# Patient Record
Sex: Female | Born: 1990 | Race: White | Hispanic: No | Marital: Single | Smoking: Never smoker
Health system: Southern US, Community
[De-identification: ages and names within clinical notes are randomized; demographics above are authoritative.]

## PROBLEM LIST (undated history)

## (undated) DIAGNOSIS — J45909 Unspecified asthma, uncomplicated: Secondary | ICD-10-CM

## (undated) HISTORY — DX: Unspecified asthma, uncomplicated: J45.909

---
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
## 2014-12-14 NOTE — L&D Delivery Note (Cosign Needed)
Delivery Note  Patient presented evening of 12/2 for induction for  Oligohydramnios. Cytotec and foley utilized for ripening. Pitocin then given. No significant fetal heart rate abnormalities.   At  a viable female infant was delivered via  (Presentation: OA).  APGAR: 8/9 ; weight  3030 g.   Placenta status: normal Cord: 3 vessel.  with the following complications: none.  Cord pH: not obtained  Anesthesia: Epidural  Episiotomy:  none Lacerations:  Right periuretheral Suture Repair: 4-0 vicryl Est. Blood Loss (mL):  50 ml  Mom to postpartum.  Baby to Couplet care / Skin to Skin.  Rebecca Sullivan 11/17/2015, 3:20 AM

## 2015-05-16 ENCOUNTER — Other Ambulatory Visit (HOSPITAL_COMMUNITY): Payer: Self-pay | Admitting: Nurse Practitioner

## 2015-05-16 DIAGNOSIS — O3680X9 Pregnancy with inconclusive fetal viability, other fetus: Secondary | ICD-10-CM

## 2015-05-16 LAB — OB RESULTS CONSOLE VARICELLA ZOSTER ANTIBODY, IGG: Varicella: NON-IMMUNE/NOT IMMUNE

## 2015-05-16 LAB — OB RESULTS CONSOLE ABO/RH: RH TYPE: POSITIVE

## 2015-05-16 LAB — OB RESULTS CONSOLE GC/CHLAMYDIA
CHLAMYDIA, DNA PROBE: NEGATIVE
GC PROBE AMP, GENITAL: NEGATIVE

## 2015-05-16 LAB — CULTURE, OB URINE: Urine Culture, OB: NEGATIVE

## 2015-05-16 LAB — CYTOLOGY - PAP: CYTOLOGY - PAP: NEGATIVE

## 2015-05-16 LAB — OB RESULTS CONSOLE ANTIBODY SCREEN: ANTIBODY SCREEN: NEGATIVE

## 2015-05-16 LAB — OB RESULTS CONSOLE HEPATITIS B SURFACE ANTIGEN: Hepatitis B Surface Ag: NEGATIVE

## 2015-05-16 LAB — OB RESULTS CONSOLE HGB/HCT, BLOOD
HCT: 44 %
HEMOGLOBIN: 13.8 g/dL

## 2015-05-16 LAB — OB RESULTS CONSOLE PLATELET COUNT: Platelets: 346 10*3/uL

## 2015-05-16 LAB — CYSTIC FIBROSIS DIAGNOSTIC STUDY: Interpretation-CFDNA:: NEGATIVE

## 2015-05-16 LAB — OB RESULTS CONSOLE RPR: RPR: NONREACTIVE

## 2015-05-16 LAB — OB RESULTS CONSOLE RUBELLA ANTIBODY, IGM: Rubella: NON-IMMUNE/NOT IMMUNE

## 2015-05-16 LAB — DRUG SCREEN, URINE: DRUG SCREEN, URINE: NEGATIVE

## 2015-05-16 LAB — OB RESULTS CONSOLE HIV ANTIBODY (ROUTINE TESTING): HIV: NONREACTIVE

## 2015-05-21 ENCOUNTER — Ambulatory Visit (HOSPITAL_COMMUNITY): Payer: Self-pay

## 2015-05-23 ENCOUNTER — Encounter (HOSPITAL_COMMUNITY): Payer: Self-pay

## 2015-05-23 ENCOUNTER — Other Ambulatory Visit (HOSPITAL_COMMUNITY): Payer: Self-pay | Admitting: Nurse Practitioner

## 2015-05-23 ENCOUNTER — Ambulatory Visit (HOSPITAL_COMMUNITY): Admission: RE | Admit: 2015-05-23 | Payer: BLUE CROSS/BLUE SHIELD | Source: Ambulatory Visit

## 2015-05-23 ENCOUNTER — Ambulatory Visit (HOSPITAL_COMMUNITY)
Admission: RE | Admit: 2015-05-23 | Discharge: 2015-05-23 | Disposition: A | Discharge status: Home / Self Care | Payer: BLUE CROSS/BLUE SHIELD | Source: Ambulatory Visit | Attending: Nurse Practitioner | Admitting: Nurse Practitioner

## 2015-05-23 DIAGNOSIS — O3680X0 Pregnancy with inconclusive fetal viability, not applicable or unspecified: Secondary | ICD-10-CM | POA: Diagnosis present

## 2015-05-23 DIAGNOSIS — Z810 Family history of intellectual disabilities: Secondary | ICD-10-CM

## 2015-05-23 DIAGNOSIS — Z3A11 11 weeks gestation of pregnancy: Secondary | ICD-10-CM

## 2015-05-23 DIAGNOSIS — O3680X9 Pregnancy with inconclusive fetal viability, other fetus: Secondary | ICD-10-CM

## 2015-05-23 DIAGNOSIS — Z8279 Family history of other congenital malformations, deformations and chromosomal abnormalities: Secondary | ICD-10-CM

## 2015-05-28 NOTE — Progress Notes (Signed)
Genetic Counseling  High-Risk Gestation Note  Appointment Date:  05/23/2015 Referred By: Rebecca Sullivan Date of Birth:  10-17-1991 Partner:  Rebecca Sullivan   Pregnancy History: [id number] Estimated Date of Delivery: 12/06/15 Estimated Gestational Age: 73w6dAttending: BSeward Meth MD   I met with Rebecca Sullivan her partner, Rebecca Sullivan for genetic counseling because of a family history of congenital heart disease.  We began by reviewing the family history in detail. Mr. Rebecca Bushreported that his sister, Rebecca Sullivan was born with a hole in the heart that required surgical correction at birth. She has been treated and evaluated by cardi[place] in [place] will reportedly need another surgery for her heart. She is currently 24years old and otherwise healthy. No underlying cause was known for her congenital heart disease. No additional relatives were reported with congenital heart disease. Congenital heart defects occur in approximately 1% of pregnancies.  Congenital heart defects may occur due to multifactorial influences, chromosomal abnormalities, genetic syndromes or environmental exposures.  Isolated heart defects are generally multifactorial.  Given the reported family history and assuming multifactorial inheritance, the risk for a congenital heart defect in the current pregnancy would be approximately 1-2%. Additional information regarding the family history may alter recurrence risk assessment. We discussed the options of targeted ultrasound in the second trimester and fetal echocardiogram. They understand that prenatal ultrasound cannot diagnose or rule out all birth defects, including all congenital heart disease. Detailed ultrasound is scheduled in our office for 07/11/15, and fetal echocardiogram is scheduled for 07/12/15.   Additionally, Rebecca Sullivan reported that her sister had a son who was placed for adoption. He has mental delays and speech delay at age 1567years.  The mother reportedly had exposures to alcohol and drugs during that pregnancy. Rebecca Sullivan counseled that there are many different causes of intellectual disabilities including environmental, multifactorial, and genetic etiologies.  We discussed that a specific diagnosis for intellectual disability can be determined in approximately 50% of these individuals.  In the remaining 50% of individuals, a diagnosis may never be determined. Given the reported family history, environmental exposures in pregnancy may be the underlying cause for his features.  Regarding genetic causes, we discussed that chromosome aberrations (aneuploidy, deletions, duplications, insertions, and translocations) are responsible for a small percentage of individuals with intellectual disability.  Many individuals with chromosome aberrations have additional differences, including congenital anomalies or minor dysmorphisms.  Likewise, single gene conditions are the underlying cause of intellectual delay in some families.  We discussed that many gene conditions have intellectual disability as a feature, but also often include other physical or medical differences.  Specifically, we reviewed fragile X syndrome and the X-linked inheritance of this condition.  We discussed the option of FMR1 (the gene that when altered causes fragile X syndrome) analysis to determine whether Fragile X syndrome is the cause of intellectual disability in this family.  Sullivan. WKettlewelldeclined FMR1 analysis today.   Further genetic counseling is warranted if more information is obtained.  The family histories were otherwise found to be noncontributory for birth defects, mental retardation, and known genetic conditions. Without further information regarding the Sullivan family history, an accurate genetic risk cannot be calculated. Further genetic counseling is warranted if more information is obtained.  We reviewed available screening and diagnostic  options.  Regarding screening tests, we discussed the options of First screen, Quad screen and ultrasound. They understand that screening tests are used to modify a patient's a priori risk for  aneuploidy, typically based on age.  This estimate provides a pregnancy specific risk assessment. After reviewing these options, Rebecca Sullivan elected to have dating ultrasound and second trimester ultrasound, but declined additional screening for fetal aneuploidy including First trimester screen, NT ultrasound, and Quad screen.    She understands that ultrasound cannot rule out all birth defects or genetic syndromes. The patient was advised of this limitation and states she still does not want diagnostic testing at this time.   Rebecca Sullivan with written information regarding cystic fibrosis (CF) including the carrier frequency and incidence in the Caucasian population, the availability of carrier testing and prenatal diagnosis if indicated.  In addition, we discussed that CF is routinely screened for as part of the [place] newborn screening panel.  She declined CF testing today, given that it is possibly being performed through her OB office.   Rebecca Sullivan denied exposure to environmental toxins or chemical agents. She denied the use of alcohol, tobacco or street drugs. She denied significant viral illnesses during the course of her pregnancy. Her medical and surgical histories were noncontributory.   I counseled this couple regarding the above risks and available options.  The approximate face-to-face time with the genetic counselor was 45 minutes.  Rebecca Sullivan Certified Genetic Counselor 05/28/2015 4:57 PM            Rebecca Sullivan 05/28/2015

## 2015-07-10 ENCOUNTER — Other Ambulatory Visit (HOSPITAL_COMMUNITY): Payer: Self-pay | Admitting: Nurse Practitioner

## 2015-07-10 DIAGNOSIS — Z8279 Family history of other congenital malformations, deformations and chromosomal abnormalities: Secondary | ICD-10-CM

## 2015-07-10 DIAGNOSIS — Z3A18 18 weeks gestation of pregnancy: Secondary | ICD-10-CM

## 2015-07-10 DIAGNOSIS — Z8249 Family history of ischemic heart disease and other diseases of the circulatory system: Secondary | ICD-10-CM

## 2015-07-10 DIAGNOSIS — Z3689 Encounter for other specified antenatal screening: Secondary | ICD-10-CM

## 2015-07-11 ENCOUNTER — Encounter (HOSPITAL_COMMUNITY): Payer: Self-pay

## 2015-07-11 ENCOUNTER — Ambulatory Visit (HOSPITAL_COMMUNITY)
Admission: RE | Admit: 2015-07-11 | Discharge: 2015-07-11 | Disposition: A | Discharge status: Home / Self Care | Payer: BLUE CROSS/BLUE SHIELD | Source: Ambulatory Visit | Attending: Family | Admitting: Family

## 2015-07-11 VITALS — BP 139/81 | HR 92 | Wt 251.2 lb

## 2015-07-11 DIAGNOSIS — Z3A18 18 weeks gestation of pregnancy: Secondary | ICD-10-CM | POA: Diagnosis not present

## 2015-07-11 DIAGNOSIS — Z36 Encounter for antenatal screening of mother: Secondary | ICD-10-CM | POA: Diagnosis present

## 2015-07-11 DIAGNOSIS — Z8279 Family history of other congenital malformations, deformations and chromosomal abnormalities: Secondary | ICD-10-CM | POA: Diagnosis not present

## 2015-07-11 DIAGNOSIS — Z3689 Encounter for other specified antenatal screening: Secondary | ICD-10-CM | POA: Insufficient documentation

## 2015-07-11 DIAGNOSIS — Z0489 Encounter for examination and observation for other specified reasons: Secondary | ICD-10-CM

## 2015-07-11 DIAGNOSIS — IMO0002 Reserved for concepts with insufficient information to code with codable children: Secondary | ICD-10-CM

## 2015-07-11 LAB — AFP MATERNAL TRIPLE SCREEN: AFP, Serum: NEGATIVE

## 2015-07-12 ENCOUNTER — Other Ambulatory Visit (HOSPITAL_COMMUNITY): Payer: Self-pay | Admitting: Family

## 2015-07-12 ENCOUNTER — Other Ambulatory Visit (HOSPITAL_COMMUNITY): Payer: Self-pay | Admitting: Physician Assistant

## 2015-08-08 ENCOUNTER — Encounter (HOSPITAL_COMMUNITY): Payer: Self-pay

## 2015-08-08 ENCOUNTER — Ambulatory Visit (HOSPITAL_COMMUNITY)
Admission: RE | Admit: 2015-08-08 | Discharge: 2015-08-08 | Disposition: A | Discharge status: Home / Self Care | Payer: Medicaid Other | Source: Ambulatory Visit | Attending: Physician Assistant | Admitting: Physician Assistant

## 2015-08-08 DIAGNOSIS — Z8279 Family history of other congenital malformations, deformations and chromosomal abnormalities: Secondary | ICD-10-CM | POA: Insufficient documentation

## 2015-08-08 DIAGNOSIS — Z0489 Encounter for examination and observation for other specified reasons: Secondary | ICD-10-CM

## 2015-08-08 DIAGNOSIS — IMO0002 Reserved for concepts with insufficient information to code with codable children: Secondary | ICD-10-CM

## 2015-08-08 DIAGNOSIS — Z36 Encounter for antenatal screening of mother: Secondary | ICD-10-CM | POA: Diagnosis not present

## 2015-08-13 ENCOUNTER — Encounter: Payer: Self-pay | Admitting: Obstetrics and Gynecology

## 2015-08-13 ENCOUNTER — Ambulatory Visit (INDEPENDENT_AMBULATORY_CARE_PROVIDER_SITE_OTHER): Payer: Medicaid Other | Admitting: Obstetrics and Gynecology

## 2015-08-13 VITALS — BP 138/67 | HR 101 | Temp 98.2°F | Ht 72.0 in | Wt 255.3 lb

## 2015-08-13 DIAGNOSIS — O99210 Obesity complicating pregnancy, unspecified trimester: Secondary | ICD-10-CM | POA: Insufficient documentation

## 2015-08-13 DIAGNOSIS — Z23 Encounter for immunization: Secondary | ICD-10-CM | POA: Diagnosis not present

## 2015-08-13 DIAGNOSIS — O10919 Unspecified pre-existing hypertension complicating pregnancy, unspecified trimester: Secondary | ICD-10-CM

## 2015-08-13 DIAGNOSIS — Z8739 Personal history of other diseases of the musculoskeletal system and connective tissue: Secondary | ICD-10-CM | POA: Insufficient documentation

## 2015-08-13 DIAGNOSIS — O9989 Other specified diseases and conditions complicating pregnancy, childbirth and the puerperium: Secondary | ICD-10-CM

## 2015-08-13 DIAGNOSIS — Z789 Other specified health status: Secondary | ICD-10-CM | POA: Diagnosis not present

## 2015-08-13 DIAGNOSIS — O99212 Obesity complicating pregnancy, second trimester: Secondary | ICD-10-CM | POA: Diagnosis not present

## 2015-08-13 DIAGNOSIS — Z2839 Other underimmunization status: Secondary | ICD-10-CM

## 2015-08-13 DIAGNOSIS — O0992 Supervision of high risk pregnancy, unspecified, second trimester: Secondary | ICD-10-CM

## 2015-08-13 DIAGNOSIS — Z283 Underimmunization status: Secondary | ICD-10-CM | POA: Insufficient documentation

## 2015-08-13 DIAGNOSIS — O10912 Unspecified pre-existing hypertension complicating pregnancy, second trimester: Secondary | ICD-10-CM

## 2015-08-13 DIAGNOSIS — Z8279 Family history of other congenital malformations, deformations and chromosomal abnormalities: Secondary | ICD-10-CM

## 2015-08-13 DIAGNOSIS — O09899 Supervision of other high risk pregnancies, unspecified trimester: Secondary | ICD-10-CM

## 2015-08-13 DIAGNOSIS — O0990 Supervision of high risk pregnancy, unspecified, unspecified trimester: Secondary | ICD-10-CM | POA: Insufficient documentation

## 2015-08-13 DIAGNOSIS — E669 Obesity, unspecified: Secondary | ICD-10-CM | POA: Diagnosis not present

## 2015-08-13 LAB — POCT URINALYSIS DIP (DEVICE)
Bilirubin Urine: NEGATIVE
GLUCOSE, UA: NEGATIVE mg/dL
Nitrite: NEGATIVE
PH: 6 (ref 5.0–8.0)
Protein, ur: NEGATIVE mg/dL
Specific Gravity, Urine: >=1.03 (ref 1.005–1.030)
Urobilinogen, UA: 0.2 mg/dL (ref 0.0–1.0)

## 2015-08-13 NOTE — Progress Notes (Signed)
Breastfeeding tip of the week reviewed Initial prenatal visit at clinic, transfer from [facility] for HTN Flu vaccine Pt thinks she may have UTI due to urine frequency Ketones: trace, Hgb: trace, Leukocytes: trace

## 2015-08-13 NOTE — Patient Instructions (Signed)
Second Trimester of Pregnancy The second trimester is from week 13 through week 28, months 4 through 6. The second trimester is often a time when you feel your best. Your body has also adjusted to being pregnant, and you begin to feel better physically. Usually, morning sickness has lessened or quit completely, you may have more energy, and you may have an increase in appetite. The second trimester is also a time when the fetus is growing rapidly. At the end of the sixth month, the fetus is about 9 inches long and weighs about 1 pounds. You will likely begin to feel the baby move (quickening) between 18 and 20 weeks of the pregnancy. BODY CHANGES Your body goes through many changes during pregnancy. The changes vary from woman to woman.   Your weight will continue to increase. You will notice your lower abdomen bulging out.  You may begin to get stretch marks on your hips, abdomen, and breasts.  You may develop headaches that can be relieved by medicines approved by your health care provider.  You may urinate more often because the fetus is pressing on your bladder.  You may develop or continue to have heartburn as a result of your pregnancy.  You may develop constipation because certain hormones are causing the muscles that push waste through your intestines to slow down.  You may develop hemorrhoids or swollen, bulging veins (varicose veins).  You may have back pain because of the weight gain and pregnancy hormones relaxing your joints between the bones in your pelvis and as a result of a shift in weight and the muscles that support your balance.  Your breasts will continue to grow and be tender.  Your gums may bleed and may be sensitive to brushing and flossing.  Dark spots or blotches (chloasma, mask of pregnancy) may develop on your face. This will likely fade after the baby is born.  A dark line from your belly button to the pubic area (linea nigra) may appear. This will likely  fade after the baby is born.  You may have changes in your hair. These can include thickening of your hair, rapid growth, and changes in texture. Some women also have hair loss during or after pregnancy, or hair that feels dry or thin. Your hair will most likely return to normal after your baby is born. WHAT TO EXPECT AT YOUR PRENATAL VISITS During a routine prenatal visit:  You will be weighed to make sure you and the fetus are growing normally.  Your blood pressure will be taken.  Your abdomen will be measured to track your baby's growth.  The fetal heartbeat will be listened to.  Any test results from the previous visit will be discussed. Your health care provider may ask you:  How you are feeling.  If you are feeling the baby move.  If you have had any abnormal symptoms, such as leaking fluid, bleeding, severe headaches, or abdominal cramping.  If you have any questions. Other tests that may be performed during your second trimester include:  Blood tests that check for:  Low iron levels (anemia).  Gestational diabetes (between 24 and 28 weeks).  Rh antibodies.  Urine tests to check for infections, diabetes, or protein in the urine.  An ultrasound to confirm the proper growth and development of the baby.  An amniocentesis to check for possible genetic problems.  Fetal screens for spina bifida and Down syndrome. HOME CARE INSTRUCTIONS   Avoid all smoking, herbs, alcohol, and unprescribed   drugs. These chemicals affect the formation and growth of the baby.  Follow your health care provider's instructions regarding medicine use. There are medicines that are either safe or unsafe to take during pregnancy.  Exercise only as directed by your health care provider. Experiencing uterine cramps is a good sign to stop exercising.  Continue to eat regular, healthy meals.  Wear a good support bra for breast tenderness.  Do not use hot tubs, steam rooms, or saunas.  Wear  your seat belt at all times when driving.  Avoid raw meat, uncooked cheese, cat litter boxes, and soil used by cats. These carry germs that can cause birth defects in the baby.  Take your prenatal vitamins.  Try taking a stool softener (if your health care provider approves) if you develop constipation. Eat more high-fiber foods, such as fresh vegetables or fruit and whole grains. Drink plenty of fluids to keep your urine clear or pale yellow.  Take warm sitz baths to soothe any pain or discomfort caused by hemorrhoids. Use hemorrhoid cream if your health care provider approves.  If you develop varicose veins, wear support hose. Elevate your feet for 15 minutes, 3-4 times a day. Limit salt in your diet.  Avoid heavy lifting, wear low heel shoes, and practice good posture.  Rest with your legs elevated if you have leg cramps or low back pain.  Visit your dentist if you have not gone yet during your pregnancy. Use a soft toothbrush to brush your teeth and be gentle when you floss.  A sexual relationship may be continued unless your health care provider directs you otherwise.  Continue to go to all your prenatal visits as directed by your health care provider. SEEK MEDICAL CARE IF:   You have dizziness.  You have mild pelvic cramps, pelvic pressure, or nagging pain in the abdominal area.  You have persistent nausea, vomiting, or diarrhea.  You have a bad smelling vaginal discharge.  You have pain with urination. SEEK IMMEDIATE MEDICAL CARE IF:   You have a fever.  You are leaking fluid from your vagina.  You have spotting or bleeding from your vagina.  You have severe abdominal cramping or pain.  You have rapid weight gain or loss.  You have shortness of breath with chest pain.  You notice sudden or extreme swelling of your face, hands, ankles, feet, or legs.  You have not felt your baby move in over an hour.  You have severe headaches that do not go away with  medicine.  You have vision changes. Document Released: 11/24/2001 Document Revised: 12/05/2013 Document Reviewed: 01/31/2013 [employer] Patient Information 2015 [employer]. This information is not intended to replace advice given to you by your health care provider. Make sure you discuss any questions you have with your health care provider.  Contraception Choices Contraception (birth control) is the use of any methods or devices to prevent pregnancy. Below are some methods to help avoid pregnancy. HORMONAL METHODS   Contraceptive implant. This is a thin, plastic tube containing progesterone hormone. It does not contain estrogen hormone. Your health care provider inserts the tube in the inner part of the upper arm. The tube can remain in place for up to 3 years. After 3 years, the implant must be removed. The implant prevents the ovaries from releasing an egg (ovulation), thickens the cervical mucus to prevent sperm from entering the uterus, and thins the lining of the inside of the uterus.  Progesterone-only injections. These injections are given   every 3 months by your health care provider to prevent pregnancy. This synthetic progesterone hormone stops the ovaries from releasing eggs. It also thickens cervical mucus and changes the uterine lining. This makes it harder for sperm to survive in the uterus.  Birth control pills. These pills contain estrogen and progesterone hormone. They work by preventing the ovaries from releasing eggs (ovulation). They also cause the cervical mucus to thicken, preventing the sperm from entering the uterus. Birth control pills are prescribed by a health care provider.Birth control pills can also be used to treat heavy periods.  Minipill. This type of birth control pill contains only the progesterone hormone. They are taken every day of each month and must be prescribed by your health care provider.  Birth control patch. The patch contains hormones similar to  those in birth control pills. It must be changed once a week and is prescribed by a health care provider.  Vaginal ring. The ring contains hormones similar to those in birth control pills. It is left in the vagina for 3 weeks, removed for 1 week, and then a new one is put back in place. The patient must be comfortable inserting and removing the ring from the vagina.A health care provider's prescription is necessary.  Emergency contraception. Emergency contraceptives prevent pregnancy after unprotected sexual intercourse. This pill can be taken right after sex or up to 5 days after unprotected sex. It is most effective the sooner you take the pills after having sexual intercourse. Most emergency contraceptive pills are available without a prescription. Check with your pharmacist. Do not use emergency contraception as your only form of birth control. BARRIER METHODS   Female condom. This is a thin sheath (latex or rubber) that is worn over the penis during sexual intercourse. It can be used with spermicide to increase effectiveness.  Female condom. This is a soft, loose-fitting sheath that is put into the vagina before sexual intercourse.  Diaphragm. This is a soft, latex, dome-shaped barrier that must be fitted by a health care provider. It is inserted into the vagina, along with a spermicidal jelly. It is inserted before intercourse. The diaphragm should be left in the vagina for 6 to 8 hours after intercourse.  Cervical cap. This is a round, soft, latex or plastic cup that fits over the cervix and must be fitted by a health care provider. The cap can be left in place for up to 48 hours after intercourse.  Sponge. This is a soft, circular piece of polyurethane foam. The sponge has spermicide in it. It is inserted into the vagina after wetting it and before sexual intercourse.  Spermicides. These are chemicals that kill or block sperm from entering the cervix and uterus. They come in the form of  creams, jellies, suppositories, foam, or tablets. They do not require a prescription. They are inserted into the vagina with an applicator before having sexual intercourse. The process must be repeated every time you have sexual intercourse. INTRAUTERINE CONTRACEPTION  Intrauterine device (IUD). This is a T-shaped device that is put in a woman's uterus during a menstrual period to prevent pregnancy. There are 2 types:  Copper IUD. This type of IUD is wrapped in copper wire and is placed inside the uterus. Copper makes the uterus and fallopian tubes produce a fluid that kills sperm. It can stay in place for 10 years.  Hormone IUD. This type of IUD contains the hormone progestin (synthetic progesterone). The hormone thickens the cervical mucus and prevents sperm from   entering the uterus, and it also thins the uterine lining to prevent implantation of a fertilized egg. The hormone can weaken or kill the sperm that get into the uterus. It can stay in place for 3-5 years, depending on which type of IUD is used. PERMANENT METHODS OF CONTRACEPTION  Female tubal ligation. This is when the woman's fallopian tubes are surgically sealed, tied, or blocked to prevent the egg from traveling to the uterus.  Hysteroscopic sterilization. This involves placing a small coil or insert into each fallopian tube. Your doctor uses a technique called hysteroscopy to do the procedure. The device causes scar tissue to form. This results in permanent blockage of the fallopian tubes, so the sperm cannot fertilize the egg. It takes about 3 months after the procedure for the tubes to become blocked. You must use another form of birth control for these 3 months.  Female sterilization. This is when the female has the tubes that carry sperm tied off (vasectomy).This blocks sperm from entering the vagina during sexual intercourse. After the procedure, the man can still ejaculate fluid (semen). NATURAL PLANNING METHODS  Natural family  planning. This is not having sexual intercourse or using a barrier method (condom, diaphragm, cervical cap) on days the woman could become pregnant.  Calendar method. This is keeping track of the length of each menstrual cycle and identifying when you are fertile.  Ovulation method. This is avoiding sexual intercourse during ovulation.  Symptothermal method. This is avoiding sexual intercourse during ovulation, using a thermometer and ovulation symptoms.  Post-ovulation method. This is timing sexual intercourse after you have ovulated. Regardless of which type or method of contraception you choose, it is important that you use condoms to protect against the transmission of sexually transmitted infections (STIs). Talk with your health care provider about which form of contraception is most appropriate for you. Document Released: 11/30/2005 Document Revised: 12/05/2013 Document Reviewed: 05/25/2013 [employer] Patient Information 2015 [employer]. This information is not intended to replace advice given to you by your health care provider. Make sure you discuss any questions you have with your health care provider.  Breastfeeding Deciding to breastfeed is one of the best choices you can make for you and your baby. A change in hormones during pregnancy causes your breast tissue to grow and increases the number and size of your milk ducts. These hormones also allow proteins, sugars, and fats from your blood supply to make breast milk in your milk-producing glands. Hormones prevent breast milk from being released before your baby is born as well as prompt milk flow after birth. Once breastfeeding has begun, thoughts of your baby, as well as his or her sucking or crying, can stimulate the release of milk from your milk-producing glands.  BENEFITS OF BREASTFEEDING For Your Baby  Your first milk (colostrum) helps your baby's digestive system function better.   There are antibodies in your milk that  help your baby fight off infections.   Your baby has a lower incidence of asthma, allergies, and sudden infant death syndrome.   The nutrients in breast milk are better for your baby than infant formulas and are designed uniquely for your baby's needs.   Breast milk improves your baby's brain development.   Your baby is less likely to develop other conditions, such as childhood obesity, asthma, or type 2 diabetes mellitus.  For You   Breastfeeding helps to create a very special bond between you and your baby.   Breastfeeding is convenient. Breast milk is   always available at the correct temperature and costs nothing.   Breastfeeding helps to burn calories and helps you lose the weight gained during pregnancy.   Breastfeeding makes your uterus contract to its prepregnancy size faster and slows bleeding (lochia) after you give birth.   Breastfeeding helps to lower your risk of developing type 2 diabetes mellitus, osteoporosis, and breast or ovarian cancer later in life. SIGNS THAT YOUR BABY IS HUNGRY Early Signs of Hunger  Increased alertness or activity.  Stretching.  Movement of the head from side to side.  Movement of the head and opening of the mouth when the corner of the mouth or cheek is stroked (rooting).  Increased sucking sounds, smacking lips, cooing, sighing, or squeaking.  Hand-to-mouth movements.  Increased sucking of fingers or hands. Late Signs of Hunger  Fussing.  Intermittent crying. Extreme Signs of Hunger Signs of extreme hunger will require calming and consoling before your baby will be able to breastfeed successfully. Do not wait for the following signs of extreme hunger to occur before you initiate breastfeeding:   Restlessness.  A loud, strong cry.   Screaming. BREASTFEEDING BASICS Breastfeeding Initiation  Find a comfortable place to sit or lie down, with your neck and back well supported.  Place a pillow or rolled up blanket  under your baby to bring him or her to the level of your breast (if you are seated). Nursing pillows are specially designed to help support your arms and your baby while you breastfeed.  Make sure that your baby's abdomen is facing your abdomen.   Gently massage your breast. With your fingertips, massage from your chest wall toward your nipple in a circular motion. This encourages milk flow. You may need to continue this action during the feeding if your milk flows slowly.  Support your breast with 4 fingers underneath and your thumb above your nipple. Make sure your fingers are well away from your nipple and your baby's mouth.   Stroke your baby's lips gently with your finger or nipple.   When your baby's mouth is open wide enough, quickly bring your baby to your breast, placing your entire nipple and as much of the colored area around your nipple (areola) as possible into your baby's mouth.   More areola should be visible above your baby's upper lip than below the lower lip.   Your baby's tongue should be between his or her lower gum and your breast.   Ensure that your baby's mouth is correctly positioned around your nipple (latched). Your baby's lips should create a seal on your breast and be turned out (everted).  It is common for your baby to suck about 2-3 minutes in order to start the flow of breast milk. Latching Teaching your baby how to latch on to your breast properly is very important. An improper latch can cause nipple pain and decreased milk supply for you and poor weight gain in your baby. Also, if your baby is not latched onto your nipple properly, he or she may swallow some air during feeding. This can make your baby fussy. Burping your baby when you switch breasts during the feeding can help to get rid of the air. However, teaching your baby to latch on properly is still the best way to prevent fussiness from swallowing air while breastfeeding. Signs that your baby has  successfully latched on to your nipple:    Silent tugging or silent sucking, without causing you pain.   Swallowing heard between every 3-4   sucks.    Muscle movement above and in front of his or her ears while sucking.  Signs that your baby has not successfully latched on to nipple:   Sucking sounds or smacking sounds from your baby while breastfeeding.  Nipple pain. If you think your baby has not latched on correctly, slip your finger into the corner of your baby's mouth to break the suction and place it between your baby's gums. Attempt breastfeeding initiation again. Signs of Successful Breastfeeding Signs from your baby:   A gradual decrease in the number of sucks or complete cessation of sucking.   Falling asleep.   Relaxation of his or her body.   Retention of a small amount of milk in his or her mouth.   Letting go of your breast by himself or herself. Signs from you:  Breasts that have increased in firmness, weight, and size 1-3 hours after feeding.   Breasts that are softer immediately after breastfeeding.  Increased milk volume, as well as a change in milk consistency and color by the fifth day of breastfeeding.   Nipples that are not sore, cracked, or bleeding. Signs That Your Baby is Getting Enough Milk  Wetting at least 3 diapers in a 24-hour period. The urine should be clear and pale yellow by age 5 days.  At least 3 stools in a 24-hour period by age 5 days. The stool should be soft and yellow.  At least 3 stools in a 24-hour period by age 7 days. The stool should be seedy and yellow.  No loss of weight greater than 10% of birth weight during the first 3 days of age.  Average weight gain of 4-7 ounces (113-198 g) per week after age 4 days.  Consistent daily weight gain by age 5 days, without weight loss after the age of 2 weeks. After a feeding, your baby may spit up a small amount. This is common. BREASTFEEDING FREQUENCY AND DURATION Frequent  feeding will help you make more milk and can prevent sore nipples and breast engorgement. Breastfeed when you feel the need to reduce the fullness of your breasts or when your baby shows signs of hunger. This is called "breastfeeding on demand." Avoid introducing a pacifier to your baby while you are working to establish breastfeeding (the first 4-6 weeks after your baby is born). After this time you may choose to use a pacifier. Research has shown that pacifier use during the first year of a baby's life decreases the risk of sudden infant death syndrome (SIDS). Allow your baby to feed on each breast as long as he or she wants. Breastfeed until your baby is finished feeding. When your baby unlatches or falls asleep while feeding from the first breast, offer the second breast. Because newborns are often sleepy in the first few weeks of life, you may need to awaken your baby to get him or her to feed. Breastfeeding times will vary from baby to baby. However, the following rules can serve as a guide to help you ensure that your baby is properly fed:  Newborns (babies 4 weeks of age or younger) may breastfeed every 1-3 hours.  Newborns should not go longer than 3 hours during the day or 5 hours during the night without breastfeeding.  You should breastfeed your baby a minimum of 8 times in a 24-hour period until you begin to introduce solid foods to your baby at around 6 months of age. BREAST MILK PUMPING Pumping and storing breast milk allows   you to ensure that your baby is exclusively fed your breast milk, even at times when you are unable to breastfeed. This is especially important if you are going back to work while you are still breastfeeding or when you are not able to be present during feedings. Your lactation consultant can give you guidelines on how long it is safe to store breast milk.  A breast pump is a machine that allows you to pump milk from your breast into a sterile bottle. The pumped breast  milk can then be stored in a refrigerator or freezer. Some breast pumps are operated by hand, while others use electricity. Ask your lactation consultant which type will work best for you. Breast pumps can be purchased, but some hospitals and breastfeeding support groups lease breast pumps on a monthly basis. A lactation consultant can teach you how to hand express breast milk, if you prefer not to use a pump.  CARING FOR YOUR BREASTS WHILE YOU BREASTFEED Nipples can become dry, cracked, and sore while breastfeeding. The following recommendations can help keep your breasts moisturized and healthy:  Avoid using soap on your nipples.   Wear a supportive bra. Although not required, special nursing bras and tank tops are designed to allow access to your breasts for breastfeeding without taking off your entire bra or top. Avoid wearing underwire-style bras or extremely tight bras.  Air dry your nipples for 3-4minutes after each feeding.   Use only cotton bra pads to absorb leaked breast milk. Leaking of breast milk between feedings is normal.   Use lanolin on your nipples after breastfeeding. Lanolin helps to maintain your skin's normal moisture barrier. If you use pure lanolin, you do not need to wash it off before feeding your baby again. Pure lanolin is not toxic to your baby. You may also hand express a few drops of breast milk and gently massage that milk into your nipples and allow the milk to air dry. In the first few weeks after giving birth, some women experience extremely full breasts (engorgement). Engorgement can make your breasts feel heavy, warm, and tender to the touch. Engorgement peaks within 3-5 days after you give birth. The following recommendations can help ease engorgement:  Completely empty your breasts while breastfeeding or pumping. You may want to start by applying warm, moist heat (in the shower or with warm water-soaked hand towels) just before feeding or pumping. This  increases circulation and helps the milk flow. If your baby does not completely empty your breasts while breastfeeding, pump any extra milk after he or she is finished.  Wear a snug bra (nursing or regular) or tank top for 1-2 days to signal your body to slightly decrease milk production.  Apply ice packs to your breasts, unless this is too uncomfortable for you.  Make sure that your baby is latched on and positioned properly while breastfeeding. If engorgement persists after 48 hours of following these recommendations, contact your health care provider or a lactation consultant. OVERALL HEALTH CARE RECOMMENDATIONS WHILE BREASTFEEDING  Eat healthy foods. Alternate between meals and snacks, eating 3 of each per day. Because what you eat affects your breast milk, some of the foods may make your baby more irritable than usual. Avoid eating these foods if you are sure that they are negatively affecting your baby.  Drink milk, fruit juice, and water to satisfy your thirst (about 10 glasses a day).   Rest often, relax, and continue to take your prenatal vitamins to prevent fatigue,   stress, and anemia.  Continue breast self-awareness checks.  Avoid chewing and smoking tobacco.  Avoid alcohol and drug use. Some medicines that may be harmful to your baby can pass through breast milk. It is important to ask your health care provider before taking any medicine, including all over-the-counter and prescription medicine as well as vitamin and herbal supplements. It is possible to become pregnant while breastfeeding. If birth control is desired, ask your health care provider about options that will be safe for your baby. SEEK MEDICAL CARE IF:   You feel like you want to stop breastfeeding or have become frustrated with breastfeeding.  You have painful breasts or nipples.  Your nipples are cracked or bleeding.  Your breasts are red, tender, or warm.  You have a swollen area on either breast.  You  have a fever or chills.  You have nausea or vomiting.  You have drainage other than breast milk from your nipples.  Your breasts do not become full before feedings by the fifth day after you give birth.  You feel sad and depressed.  Your baby is too sleepy to eat well.  Your baby is having trouble sleeping.   Your baby is wetting less than 3 diapers in a 24-hour period.  Your baby has less than 3 stools in a 24-hour period.  Your baby's skin or the white part of his or her eyes becomes yellow.   Your baby is not gaining weight by 5 days of age. SEEK IMMEDIATE MEDICAL CARE IF:   Your baby is overly tired (lethargic) and does not want to wake up and feed.  Your baby develops an unexplained fever. Document Released: 11/30/2005 Document Revised: 12/05/2013 Document Reviewed: 05/24/2013 Exit[employer] Patient Information 2015 [employer]. This information is not intended to replace advice given to you by your health care provider. Make sure you discuss any questions you have with your health care provider.  

## 2015-08-13 NOTE — Progress Notes (Signed)
   Subjective:    Rebecca Sullivan is a G1P0000 [redacted]w[redacted]d being seen today for her first obstetrical visit. Patient is a transfer from [facility] to [facility]. Patient noted to have elevated BP since 14 weeks pregnancy. Patient does intend to breast feed. Pregnancy history fully reviewed.  Patient reports no complaints.  Filed Vitals:   08/13/15 1309 08/13/15 1310  BP: 138/67   Pulse: 101   Temp: 98.2 F (36.8 C)   Height:  6' (1.829 m)  Weight: 255 lb 4.8 oz (115.803 kg)     HISTORY: OB History  Gravida Para Term Preterm AB SAB TAB Ectopic Multiple Living     # Outcome Date GA Lbr Len/2nd Weight Sex Delivery Anes PTL Lv  1 Current              Past Medical History  Diagnosis Date  . Asthma    Past Surgical History  Procedure Laterality Date  . Back surgery  2009   History reviewed. No pertinent family history.   Exam    Not indicated    Assessment:    Pregnancy: G1P0000 Patient Active Problem List   Diagnosis Date Noted  . Supervision of high risk pregnancy, antepartum 08/13/2015    Priority: Medium  . History of scoliosis 08/13/2015    Priority: Medium  . Obesity affecting pregnancy, antepartum 08/13/2015    Priority: Medium  . Obesity 08/13/2015    Priority: Medium  . Rubella non-immune status, antepartum 08/13/2015    Priority: Medium  . Susceptible to varicella (non-immune), currently pregnant 08/13/2015    Priority: Medium  . Chronic hypertension during pregnancy, antepartum 08/13/2015    Priority: Medium  . Family history of congenital heart disease     Priority: Medium        Plan:     Initial labs drawn. Prenatal vitamins. Problem list reviewed and updated. Genetic Screening discussed Quad Screen: results reviewed.  Ultrasound discussed; fetal survey: results reviewed. Discussed implication of CHTN on pregnancy. Discussed need for frequent ultrasound to monitor fetal growth  Follow up in 3 weeks. 50% of 30 min  visit spent on counseling and coordination of care.     Lyncoln Maskell 08/13/2015

## 2015-08-15 LAB — CULTURE, OB URINE: Colony Count: 40000

## 2015-09-12 ENCOUNTER — Ambulatory Visit (INDEPENDENT_AMBULATORY_CARE_PROVIDER_SITE_OTHER): Payer: Medicaid Other | Admitting: Family Medicine

## 2015-09-12 VITALS — BP 127/65 | HR 90 | Temp 98.2°F | Wt 258.6 lb

## 2015-09-12 DIAGNOSIS — Z23 Encounter for immunization: Secondary | ICD-10-CM | POA: Diagnosis not present

## 2015-09-12 DIAGNOSIS — O0992 Supervision of high risk pregnancy, unspecified, second trimester: Secondary | ICD-10-CM

## 2015-09-12 DIAGNOSIS — O10919 Unspecified pre-existing hypertension complicating pregnancy, unspecified trimester: Secondary | ICD-10-CM

## 2015-09-12 DIAGNOSIS — O10912 Unspecified pre-existing hypertension complicating pregnancy, second trimester: Secondary | ICD-10-CM

## 2015-09-12 DIAGNOSIS — Z3492 Encounter for supervision of normal pregnancy, unspecified, second trimester: Secondary | ICD-10-CM

## 2015-09-12 LAB — CBC
HEMATOCRIT: 33.3 % — AB (ref 36.0–46.0)
Hemoglobin: 11 g/dL — ABNORMAL LOW (ref 12.0–15.0)
MCH: 25 pg — AB (ref 26.0–34.0)
MCHC: 33 g/dL (ref 30.0–36.0)
MCV: 75.7 fL — ABNORMAL LOW (ref 78.0–100.0)
MPV: 9.8 fL (ref 8.6–12.4)
Platelets: 304 10*3/uL (ref 150–400)
RBC: 4.4 MIL/uL (ref 3.87–5.11)
RDW: 13.9 % (ref 11.5–15.5)
WBC: 15.4 10*3/uL — ABNORMAL HIGH (ref 4.0–10.5)

## 2015-09-12 LAB — POCT URINALYSIS DIP (DEVICE)
BILIRUBIN URINE: NEGATIVE
Glucose, UA: NEGATIVE mg/dL
HGB URINE DIPSTICK: NEGATIVE
KETONES UR: NEGATIVE mg/dL
Nitrite: NEGATIVE
PH: 7 (ref 5.0–8.0)
Protein, ur: NEGATIVE mg/dL
SPECIFIC GRAVITY, URINE: 1.015 (ref 1.005–1.030)
Urobilinogen, UA: 0.2 mg/dL (ref 0.0–1.0)

## 2015-09-12 MED ORDER — TETANUS-DIPHTH-ACELL PERTUSSIS 5-2.5-18.5 LF-MCG/0.5 IM SUSP
0.5000 mL | Freq: Once | INTRAMUSCULAR | Status: AC
  Administered 2015-09-12: 0.5 mL via INTRAMUSCULAR

## 2015-09-12 NOTE — Patient Instructions (Signed)
Third Trimester of Pregnancy The third trimester is from week 29 through week 42, months 7 through 9. The third trimester is a time when the fetus is growing rapidly. At the end of the ninth month, the fetus is about 20 inches in length and weighs 6-10 pounds.  BODY CHANGES Your body goes through many changes during pregnancy. The changes vary from woman to woman.   Your weight will continue to increase. You can expect to gain 25-35 pounds (11-16 kg) by the end of the pregnancy.  You may begin to get stretch marks on your hips, abdomen, and breasts.  You may urinate more often because the fetus is moving lower into your pelvis and pressing on your bladder.  You may develop or continue to have heartburn as a result of your pregnancy.  You may develop constipation because certain hormones are causing the muscles that push waste through your intestines to slow down.  You may develop hemorrhoids or swollen, bulging veins (varicose veins).  You may have pelvic pain because of the weight gain and pregnancy hormones relaxing your joints between the bones in your pelvis. Backaches may result from overexertion of the muscles supporting your posture.  You may have changes in your hair. These can include thickening of your hair, rapid growth, and changes in texture. Some women also have hair loss during or after pregnancy, or hair that feels dry or thin. Your hair will most likely return to normal after your baby is born.  Your breasts will continue to grow and be tender. A yellow discharge may leak from your breasts called colostrum.  Your belly button may stick out.  You may feel short of breath because of your expanding uterus.  You may notice the fetus "dropping," or moving lower in your abdomen.  You may have a bloody mucus discharge. This usually occurs a few days to a week before labor begins.  Your cervix becomes thin and soft (effaced) near your due date. WHAT TO EXPECT AT YOUR PRENATAL  EXAMS  You will have prenatal exams every 2 weeks until week 36. Then, you will have weekly prenatal exams. During a routine prenatal visit:  You will be weighed to make sure you and the fetus are growing normally.  Your blood pressure is taken.  Your abdomen will be measured to track your baby's growth.  The fetal heartbeat will be listened to.  Any test results from the previous visit will be discussed.  You may have a cervical check near your due date to see if you have effaced. At around 36 weeks, your caregiver will check your cervix. At the same time, your caregiver will also perform a test on the secretions of the vaginal tissue. This test is to determine if a type of bacteria, Group B streptococcus, is present. Your caregiver will explain this further. Your caregiver may ask you:  What your birth plan is.  How you are feeling.  If you are feeling the baby move.  If you have had any abnormal symptoms, such as leaking fluid, bleeding, severe headaches, or abdominal cramping.  If you have any questions. Other tests or screenings that may be performed during your third trimester include:  Blood tests that check for low iron levels (anemia).  Fetal testing to check the health, activity level, and growth of the fetus. Testing is done if you have certain medical conditions or if there are problems during the pregnancy. FALSE LABOR You may feel small, irregular contractions that   eventually go away. These are called Braxton Hicks contractions, or false labor. Contractions may last for hours, days, or even weeks before true labor sets in. If contractions come at regular intervals, intensify, or become painful, it is best to be seen by your caregiver.  SIGNS OF LABOR   Menstrual-like cramps.  Contractions that are 5 minutes apart or less.  Contractions that start on the top of the uterus and spread down to the lower abdomen and back.  A sense of increased pelvic pressure or back  pain.  A watery or bloody mucus discharge that comes from the vagina. If you have any of these signs before the 37th week of pregnancy, call your caregiver right away. You need to go to the hospital to get checked immediately. HOME CARE INSTRUCTIONS   Avoid all smoking, herbs, alcohol, and unprescribed drugs. These chemicals affect the formation and growth of the baby.  Follow your caregiver's instructions regarding medicine use. There are medicines that are either safe or unsafe to take during pregnancy.  Exercise only as directed by your caregiver. Experiencing uterine cramps is a good sign to stop exercising.  Continue to eat regular, healthy meals.  Wear a good support bra for breast tenderness.  Do not use hot tubs, steam rooms, or saunas.  Wear your seat belt at all times when driving.  Avoid raw meat, uncooked cheese, cat litter boxes, and soil used by cats. These carry germs that can cause birth defects in the baby.  Take your prenatal vitamins.  Try taking a stool softener (if your caregiver approves) if you develop constipation. Eat more high-fiber foods, such as fresh vegetables or fruit and whole grains. Drink plenty of fluids to keep your urine clear or pale yellow.  Take warm sitz baths to soothe any pain or discomfort caused by hemorrhoids. Use hemorrhoid cream if your caregiver approves.  If you develop varicose veins, wear support hose. Elevate your feet for 15 minutes, 3-4 times a day. Limit salt in your diet.  Avoid heavy lifting, wear low heal shoes, and practice good posture.  Rest a lot with your legs elevated if you have leg cramps or low back pain.  Visit your dentist if you have not gone during your pregnancy. Use a soft toothbrush to brush your teeth and be gentle when you floss.  A sexual relationship may be continued unless your caregiver directs you otherwise.  Do not travel far distances unless it is absolutely necessary and only with the approval  of your caregiver.  Take prenatal classes to understand, practice, and ask questions about the labor and delivery.  Make a trial run to the hospital.  Pack your hospital bag.  Prepare the baby's nursery.  Continue to go to all your prenatal visits as directed by your caregiver. SEEK MEDICAL CARE IF:  You are unsure if you are in labor or if your water has broken.  You have dizziness.  You have mild pelvic cramps, pelvic pressure, or nagging pain in your abdominal area.  You have persistent nausea, vomiting, or diarrhea.  You have a bad smelling vaginal discharge.  You have pain with urination. SEEK IMMEDIATE MEDICAL CARE IF:   You have a fever.  You are leaking fluid from your vagina.  You have spotting or bleeding from your vagina.  You have severe abdominal cramping or pain.  You have rapid weight loss or gain.  You have shortness of breath with chest pain.  You notice sudden or extreme swelling   of your face, hands, ankles, feet, or legs.  You have not felt your baby move in over an hour.  You have severe headaches that do not go away with medicine.  You have vision changes. Document Released: 11/24/2001 Document Revised: 12/05/2013 Document Reviewed: 01/31/2013 Exit[employer] Patient Information 2015 [employer]. This information is not intended to replace advice given to you by your health care Klani Caridi. Make sure you discuss any questions you have with your health care Latash Nouri.  

## 2015-09-12 NOTE — Progress Notes (Signed)
Breastfeeding tip of the week reviewed 1 hour gtt with 28 wk labs 28 wk educational material Tdap vaccine

## 2015-09-12 NOTE — Progress Notes (Signed)
Subjective:  Rebecca Sullivan is a 24 y.o. G1P0000 at [redacted]w[redacted]d being seen today for ongoing prenatal care.  Patient reports no complaints.  Contractions: Not present.  Vag. Bleeding: None. Movement: Present. Denies leaking of fluid.   The following portions of the patient's history were reviewed and updated as appropriate: allergies, current medications, past family history, past medical history, past social history, past surgical history and problem list.   Objective:   Filed Vitals:   09/12/15 0825  BP: 127/65  Pulse: 90  Temp: 98.2 F (36.8 C)  Weight: 258 lb 9.6 oz (117.3 kg)    Fetal Status: Fetal Heart Rate (bpm): 142 Fundal Height: 28 cm Movement: Present     General:  Alert, oriented and cooperative. Patient is in no acute distress.  Skin: Skin is warm and dry. No rash noted.   Cardiovascular: Normal heart rate noted  Respiratory: Normal respiratory effort, no problems with respiration noted  Abdomen: Soft, gravid, appropriate for gestational age. Pain/Pressure: Absent     Pelvic: Vag. Bleeding: None     Cervical exam deferred        Extremities: Normal range of motion.  Edema: None  Mental Status: Normal mood and affect. Normal behavior. Normal judgment and thought content.   Urinalysis:      Assessment and Plan:  Pregnancy: G1P0000 at [redacted]w[redacted]d  1. Prenatal care in second trimester 28 week labs.  FH and FHT normal - Glucose Tolerance, 1 HR (50g) w/o Fasting - CBC - RPR - HIV antibody (with reflex)  2. Need for Tdap vaccination  - Tdap (BOOSTRIX) injection 0.5 mL; Inject 0.5 mLs into the muscle once.  3. Supervision of high risk pregnancy, antepartum, second trimester   4. Chronic hypertension during pregnancy, antepartum Normal BP  Preterm labor symptoms and general obstetric precautions including but not limited to vaginal bleeding, contractions, leaking of fluid and fetal movement were reviewed in detail with the patient. Please refer to After Visit Summary  for other counseling recommendations.  No Follow-up on file.   Levie Heritage, DO

## 2015-09-13 LAB — GLUCOSE TOLERANCE, 1 HOUR (50G) W/O FASTING: Glucose, 1 Hour GTT: 118 mg/dL (ref 70–140)

## 2015-09-13 LAB — RPR

## 2015-09-13 LAB — HIV ANTIBODY (ROUTINE TESTING W REFLEX): HIV: NONREACTIVE

## 2015-09-26 ENCOUNTER — Ambulatory Visit (INDEPENDENT_AMBULATORY_CARE_PROVIDER_SITE_OTHER): Payer: Medicaid Other | Admitting: Obstetrics & Gynecology

## 2015-09-26 VITALS — BP 114/72 | HR 118 | Temp 98.2°F | Wt 260.3 lb

## 2015-09-26 DIAGNOSIS — O0993 Supervision of high risk pregnancy, unspecified, third trimester: Secondary | ICD-10-CM | POA: Diagnosis present

## 2015-09-26 LAB — POCT URINALYSIS DIP (DEVICE)
Bilirubin Urine: NEGATIVE
Glucose, UA: 100 mg/dL — AB
Hgb urine dipstick: NEGATIVE
Ketones, ur: NEGATIVE mg/dL
Nitrite: NEGATIVE
PROTEIN: NEGATIVE mg/dL
Specific Gravity, Urine: 1.02 (ref 1.005–1.030)
Urobilinogen, UA: 0.2 mg/dL (ref 0.0–1.0)
pH: 7 (ref 5.0–8.0)

## 2015-09-26 NOTE — Progress Notes (Signed)
States white coat syndrome  Subjective:  Rebecca Sullivan is a 24 y.o. G1P0000 at 1490w6d being seen today for ongoing prenatal care.  Patient reports no complaints.  Contractions: Not present.  Vag. Bleeding: None. Movement: Present. Denies leaking of fluid.   The following portions of the patient's history were reviewed and updated as appropriate: allergies, current medications, past family history, past medical history, past social history, past surgical history and problem list. Problem list updated.  Objective:   Filed Vitals:   09/26/15 0828  BP: 114/72  Pulse: 118  Temp: 98.2 F (36.8 C)  Weight: 260 lb 4.8 oz (118.071 kg)    Fetal Status: Fetal Heart Rate (bpm): 154   Movement: Present     General:  Alert, oriented and cooperative. Patient is in no acute distress.  Skin: Skin is warm and dry. No rash noted.   Cardiovascular: Normal heart rate noted  Respiratory: Normal respiratory effort, no problems with respiration noted  Abdomen: Soft, gravid, appropriate for gestational age. Pain/Pressure: Absent     Pelvic: Vag. Bleeding: None Vag D/C Character: Other (Comment)   Cervical exam deferred        Extremities: Normal range of motion.  Edema: None  Mental Status: Normal mood and affect. Normal behavior. Normal judgment and thought content.   Urinalysis: Urine Protein: Negative Urine Glucose: 1+  Assessment and Plan:  Pregnancy: G1P0000 at 6690w6d  1. Supervision of high risk pregnancy, antepartum, third trimester referred for Htn but normotensive  Preterm labor symptoms and general obstetric precautions including but not limited to vaginal bleeding, contractions, leaking of fluid and fetal movement were reviewed in detail with the patient. Please refer to After Visit Summary for other counseling recommendations.  Return in about 2 weeks (around 10/10/2015).   Adam PhenixJames G Torie Priebe, MD

## 2015-09-26 NOTE — Patient Instructions (Signed)

## 2015-10-10 ENCOUNTER — Encounter: Payer: Self-pay | Admitting: Family Medicine

## 2015-10-10 ENCOUNTER — Ambulatory Visit (INDEPENDENT_AMBULATORY_CARE_PROVIDER_SITE_OTHER): Payer: Medicaid Other | Admitting: Family Medicine

## 2015-10-10 VITALS — BP 134/80 | HR 115 | Wt 264.2 lb

## 2015-10-10 DIAGNOSIS — O99213 Obesity complicating pregnancy, third trimester: Secondary | ICD-10-CM

## 2015-10-10 DIAGNOSIS — O10913 Unspecified pre-existing hypertension complicating pregnancy, third trimester: Secondary | ICD-10-CM | POA: Diagnosis not present

## 2015-10-10 DIAGNOSIS — Z8739 Personal history of other diseases of the musculoskeletal system and connective tissue: Secondary | ICD-10-CM

## 2015-10-10 DIAGNOSIS — O10919 Unspecified pre-existing hypertension complicating pregnancy, unspecified trimester: Secondary | ICD-10-CM

## 2015-10-10 DIAGNOSIS — O9989 Other specified diseases and conditions complicating pregnancy, childbirth and the puerperium: Secondary | ICD-10-CM | POA: Diagnosis not present

## 2015-10-10 DIAGNOSIS — O0993 Supervision of high risk pregnancy, unspecified, third trimester: Secondary | ICD-10-CM

## 2015-10-10 DIAGNOSIS — E669 Obesity, unspecified: Secondary | ICD-10-CM

## 2015-10-10 DIAGNOSIS — Z789 Other specified health status: Secondary | ICD-10-CM | POA: Diagnosis not present

## 2015-10-10 DIAGNOSIS — Z8279 Family history of other congenital malformations, deformations and chromosomal abnormalities: Secondary | ICD-10-CM

## 2015-10-10 DIAGNOSIS — Z283 Underimmunization status: Secondary | ICD-10-CM | POA: Diagnosis not present

## 2015-10-10 DIAGNOSIS — O09899 Supervision of other high risk pregnancies, unspecified trimester: Secondary | ICD-10-CM

## 2015-10-10 LAB — POCT URINALYSIS DIP (DEVICE)
Bilirubin Urine: NEGATIVE
GLUCOSE, UA: 100 mg/dL — AB
Hgb urine dipstick: NEGATIVE
Nitrite: NEGATIVE
PH: 6 (ref 5.0–8.0)
PROTEIN: NEGATIVE mg/dL
SPECIFIC GRAVITY, URINE: 1.025 (ref 1.005–1.030)
Urobilinogen, UA: 0.2 mg/dL (ref 0.0–1.0)

## 2015-10-10 NOTE — Patient Instructions (Addendum)
Third Trimester of Pregnancy The third trimester is from week 29 through week 42, months 7 through 9. The third trimester is a time when the fetus is growing rapidly. At the end of the ninth month, the fetus is about 20 inches in length and weighs 6-10 pounds.  BODY CHANGES Your body goes through many changes during pregnancy. The changes vary from woman to woman.   Your weight will continue to increase. You can expect to gain 25-35 pounds (11-16 kg) by the end of the pregnancy.  You may begin to get stretch marks on your hips, abdomen, and breasts.  You may urinate more often because the fetus is moving lower into your pelvis and pressing on your bladder.  You may develop or continue to have heartburn as a result of your pregnancy.  You may develop constipation because certain hormones are causing the muscles that push waste through your intestines to slow down.  You may develop hemorrhoids or swollen, bulging veins (varicose veins).  You may have pelvic pain because of the weight gain and pregnancy hormones relaxing your joints between the bones in your pelvis. Backaches may result from overexertion of the muscles supporting your posture.  You may have changes in your hair. These can include thickening of your hair, rapid growth, and changes in texture. Some women also have hair loss during or after pregnancy, or hair that feels dry or thin. Your hair will most likely return to normal after your baby is born.  Your breasts will continue to grow and be tender. A yellow discharge may leak from your breasts called colostrum.  Your belly button may stick out.  You may feel short of breath because of your expanding uterus.  You may notice the fetus "dropping," or moving lower in your abdomen.  You may have a bloody mucus discharge. This usually occurs a few days to a week before labor begins.  Your cervix becomes thin and soft (effaced) near your due date. WHAT TO EXPECT AT YOUR PRENATAL  EXAMS  You will have prenatal exams every 2 weeks until week 36. Then, you will have weekly prenatal exams. During a routine prenatal visit:  You will be weighed to make sure you and the fetus are growing normally.  Your blood pressure is taken.  Your abdomen will be measured to track your baby's growth.  The fetal heartbeat will be listened to.  Any test results from the previous visit will be discussed.  You may have a cervical check near your due date to see if you have effaced. At around 36 weeks, your caregiver will check your cervix. At the same time, your caregiver will also perform a test on the secretions of the vaginal tissue. This test is to determine if a type of bacteria, Group B streptococcus, is present. Your caregiver will explain this further. Your caregiver may ask you:  What your birth plan is.  How you are feeling.  If you are feeling the baby move.  If you have had any abnormal symptoms, such as leaking fluid, bleeding, severe headaches, or abdominal cramping.  If you are using any tobacco products, including cigarettes, chewing tobacco, and electronic cigarettes.  If you have any questions. Other tests or screenings that may be performed during your third trimester include:  Blood tests that check for low iron levels (anemia).  Fetal testing to check the health, activity level, and growth of the fetus. Testing is done if you have certain medical conditions or if   there are problems during the pregnancy.  HIV (human immunodeficiency virus) testing. If you are at high risk, you may be screened for HIV during your third trimester of pregnancy. FALSE LABOR You may feel small, irregular contractions that eventually go away. These are called Braxton Hicks contractions, or false labor. Contractions may last for hours, days, or even weeks before true labor sets in. If contractions come at regular intervals, intensify, or become painful, it is best to be seen by your  caregiver.  SIGNS OF LABOR   Menstrual-like cramps.  Contractions that are 5 minutes apart or less.  Contractions that start on the top of the uterus and spread down to the lower abdomen and back.  A sense of increased pelvic pressure or back pain.  A watery or bloody mucus discharge that comes from the vagina. If you have any of these signs before the 37th week of pregnancy, call your caregiver right away. You need to go to the hospital to get checked immediately. HOME CARE INSTRUCTIONS   Avoid all smoking, herbs, alcohol, and unprescribed drugs. These chemicals affect the formation and growth of the baby.  Do not use any tobacco products, including cigarettes, chewing tobacco, and electronic cigarettes. If you need help quitting, ask your health care provider. You may receive counseling support and other resources to help you quit.  Follow your caregiver's instructions regarding medicine use. There are medicines that are either safe or unsafe to take during pregnancy.  Exercise only as directed by your caregiver. Experiencing uterine cramps is a good sign to stop exercising.  Continue to eat regular, healthy meals.  Wear a good support bra for breast tenderness.  Do not use hot tubs, steam rooms, or saunas.  Wear your seat belt at all times when driving.  Avoid raw meat, uncooked cheese, cat litter boxes, and soil used by cats. These carry germs that can cause birth defects in the baby.  Take your prenatal vitamins.  Take 1500-2000 mg of calcium daily starting at the 20th week of pregnancy until you deliver your baby.  Try taking a stool softener (if your caregiver approves) if you develop constipation. Eat more high-fiber foods, such as fresh vegetables or fruit and whole grains. Drink plenty of fluids to keep your urine clear or pale yellow.  Take warm sitz baths to soothe any pain or discomfort caused by hemorrhoids. Use hemorrhoid cream if your caregiver approves.  If  you develop varicose veins, wear support hose. Elevate your feet for 15 minutes, 3-4 times a day. Limit salt in your diet.  Avoid heavy lifting, wear low heal shoes, and practice good posture.  Rest a lot with your legs elevated if you have leg cramps or low back pain.  Visit your dentist if you have not gone during your pregnancy. Use a soft toothbrush to brush your teeth and be gentle when you floss.  A sexual relationship may be continued unless your caregiver directs you otherwise.  Do not travel far distances unless it is absolutely necessary and only with the approval of your caregiver.  Take prenatal classes to understand, practice, and ask questions about the labor and delivery.  Make a trial run to the hospital.  Pack your hospital bag.  Prepare the baby's nursery.  Continue to go to all your prenatal visits as directed by your caregiver. SEEK MEDICAL CARE IF:  You are unsure if you are in labor or if your water has broken.  You have dizziness.  You have  mild pelvic cramps, pelvic pressure, or nagging pain in your abdominal area.  You have persistent nausea, vomiting, or diarrhea.  You have a bad smelling vaginal discharge.  You have pain with urination. SEEK IMMEDIATE MEDICAL CARE IF:   You have a fever.  You are leaking fluid from your vagina.  You have spotting or bleeding from your vagina.  You have severe abdominal cramping or pain.  You have rapid weight loss or gain.  You have shortness of breath with chest pain.  You notice sudden or extreme swelling of your face, hands, ankles, feet, or legs.  You have not felt your baby move in over an hour.  You have severe headaches that do not go away with medicine.  You have vision changes.   This information is not intended to replace advice given to you by your health care provider. Make sure you discuss any questions you have with your health care provider.   Document Released: 11/24/2001 Document  Revised: 12/21/2014 Document Reviewed: 01/31/2013 Elsevier Interactive Patient Education 2016 Elsevier Inc.    Natural Childbirth Natural childbirth is going through labor and delivery without any drugs to relieve pain. You also do not use fetal monitors, have a cesarean delivery, or get a sugical cut to enlarge the vaginal opening (episiotomy). With the help of a birthing professional (midwife), you will direct your own labor and delivery as you choose. Many women chose natural childbirth because they feel more in control and in touch with their labor and delivery. They are also concerned about the medications affecting themselves and the baby. Pregnant women with a high risk pregnancy should not attempt natural childbirth. It is better to deliver the infant in a hospital if an emergency situation arises. Sometimes, the caregiver has to intervene for the health and safety of the mother and infant. TWO TECHNIQUES FOR NATURAL CHILDBIRTH:  The Lamaze method. This method teaches women that having a baby is normal, healthy, and natural. It also teaches the mother to take a neutral position regarding pain medication and anesthesia and to make an informed decision if and when it is right for them. The Erven Colla (also called husband coached birth). This method teaches the father to be the birth coach and stresses a natural approach. It also encourages exercise and a balanced diet with good nutrition. The exercises teach relaxation and deep breathing techniques. However, there are also classes to prepare the parents for an emergency situation that may occur. METHODS OF DEALING WITH LABOR PAIN AND DELIVERY: Meditation. Yoga. Hypnosis. Acupuncture. Massage. Changing positions (walking, rocking, showering, leaning on birth balls). Lying in warm water or a jacuzzi. Find an activity that keeps your mind off of the labor pain. Listen to soft music. Visual imagery (focus on a particular object). BEFORE  GOING INTO LABOR Be sure you and your spouse/partner are in agreement to have natural childbirth. Decide if your caregiver or a midwife will deliver your baby. Decide if you will have your baby in the hospital, birthing center, or at home. If you have children, make plans to have someone to take care of them when you go to the hospital. Know the distance and the time it takes to go to the delivery center. Make a dry run to be sure. Have a bag packed with a night gown, bathrobe, and toiletries ready to take when you go into labor. Keep phone numbers of your family and friends handy if you need to call someone when you go into labor.  Your spouse or partner should go to all the teaching classes. Talk with your caregiver about the possibility of a medical emergency and what will happen if that occurs. ADVANTAGES OF NATURAL CHILDBIRTH You are in control of your labor and delivery. It is safe. There are no medications or anesthetics that may affect you and the fetus. There are no invasive procedures such as an episiotomy. You and your partner will work together, which can increase your bond. Meditation, yoga, massage, and breathing exercises can be learned while pregnant and help you when you are in labor and at delivery. In most delivery centers, the family and friends can be involved in the labor and delivery process. DISADVANTAGES OF NATURAL CHILDBIRTH You will experience pain during your labor and delivery. The methods of helping relieve your labor pains may not work for you. You may feel embarrassed, disappointed, and like a failure if you decide to change your mind during labor and not have natural childbirth. AFTER THE DELIVERY You will be very tired. You will be uncomfortable because of your uterus contracting. You will feel soreness around the vagina. You may feel cold and shaky.This is a natural reaction. You will be excited, overwhelmed, accomplished, and proud to be a mother. HOME  CARE INSTRUCTIONS  Follow the advice and instructions of your caregiver. Follow the instructions of your natural childbirth instructor (Lamaze or Bradley Method).   This information is not intended to replace advice given to you by your health care provider. Make sure you discuss any questions you have with your health care provider.   Document Released: 11/12/2008 Document Revised: 02/22/2012 Document Reviewed: 08/07/2013 Elsevier Interactive Patient Education [employer]2016 Elsevier Inc.

## 2015-10-10 NOTE — Progress Notes (Signed)
Subjective:  Rebecca Sullivan is a 24 y.o. G1P0000 at 51w6dbeing seen today for ongoing prenatal care.  Patient reports no complaints.  Contractions: Not present.  Vag. Bleeding: None. Movement: Present. Denies leaking of fluid.   The following portions of the patient's history were reviewed and updated as appropriate: allergies, current medications, past family history, past medical history, past social history, past surgical history and problem list. Problem list updated.  Objective:   Filed Vitals:   10/10/15 0825  BP: 134/80  Pulse: 115  Weight: 264 lb 3.2 oz (119.84 kg)    Fetal Status: Fetal Heart Rate (bpm): 142 Fundal Height: 31 cm Movement: Present     General:  Alert, oriented and cooperative. Patient is in no acute distress.  Skin: Skin is warm and dry. No rash noted.   Cardiovascular: Normal heart rate noted  Respiratory: Normal respiratory effort, no problems with respiration noted  Abdomen: Soft, gravid, appropriate for gestational age. Pain/Pressure: Absent     Pelvic: Vag. Bleeding: None     Cervical exam deferred        Extremities: Normal range of motion.  Edema: None  Mental Status: Normal mood and affect. Normal behavior. Normal judgment and thought content.   Urinalysis:      Assessment and Plan:  Pregnancy: G1P0000 at 367w6d1. Chronic hypertension during pregnancy, antepartum BP is wnl. Not currently on medications.   2. Family history of congenital heart disease - anatomy is normal  3. Supervision of high risk pregnancy, antepartum, third trimester -Updated box  4. History of scoliosis -complicates desire for epidural  - Wrote anesthesia about possible pre-care visit  5. Obesity affecting pregnancy, antepartum, third trimester  6. Susceptible to varicella (non-immune), currently pregnant - needs varicella vaccination  7. Rubella non-immune status, antepartum -Needs pp MMR  Preterm labor symptoms and general obstetric precautions  including but not limited to vaginal bleeding, contractions, leaking of fluid and fetal movement were reviewed in detail with the patient. Please refer to After Visit Summary for other counseling recommendations.  Return in about 2 weeks (around 10/24/2015) for Routine prenatal care.   KiCaren MacadamMD

## 2015-10-24 ENCOUNTER — Encounter: Payer: Self-pay | Admitting: Family Medicine

## 2015-10-24 ENCOUNTER — Ambulatory Visit (INDEPENDENT_AMBULATORY_CARE_PROVIDER_SITE_OTHER): Payer: Medicaid Other | Admitting: Family Medicine

## 2015-10-24 ENCOUNTER — Telehealth: Payer: Self-pay | Admitting: *Deleted

## 2015-10-24 VITALS — BP 131/78 | HR 95 | Temp 98.2°F | Wt 263.8 lb

## 2015-10-24 DIAGNOSIS — O10913 Unspecified pre-existing hypertension complicating pregnancy, third trimester: Secondary | ICD-10-CM

## 2015-10-24 DIAGNOSIS — O0993 Supervision of high risk pregnancy, unspecified, third trimester: Secondary | ICD-10-CM

## 2015-10-24 DIAGNOSIS — O10919 Unspecified pre-existing hypertension complicating pregnancy, unspecified trimester: Secondary | ICD-10-CM

## 2015-10-24 LAB — POCT URINALYSIS DIP (DEVICE)
BILIRUBIN URINE: NEGATIVE
Glucose, UA: NEGATIVE mg/dL
Hgb urine dipstick: NEGATIVE
Ketones, ur: NEGATIVE mg/dL
Leukocytes, UA: NEGATIVE
NITRITE: NEGATIVE
PH: 7 (ref 5.0–8.0)
Protein, ur: NEGATIVE mg/dL
Specific Gravity, Urine: 1.02 (ref 1.005–1.030)
UROBILINOGEN UA: 0.2 mg/dL (ref 0.0–1.0)

## 2015-10-24 NOTE — Progress Notes (Signed)
Subjective:  Rebecca Sullivan is a 24 y.o. G1P0000 at 3215w6d being seen today for ongoing prenatal care.  Patient reports no complaints.  Contractions: Not present.  Vag. Bleeding: None. Movement: Present. Denies leaking of fluid.   The following portions of the patient's history were reviewed and updated as appropriate: allergies, current medications, past family history, past medical history, past social history, past surgical history and problem list. Problem list updated.  Objective:   Filed Vitals:   10/24/15 1015  BP: 154/88  Pulse: 95  Temp: 98.2 F (36.8 C)  Weight: 263 lb 12.8 oz (119.659 kg)    Fetal Status: Fetal Heart Rate (bpm): 140   Movement: Present     General:  Alert, oriented and cooperative. Patient is in no acute distress.  Skin: Skin is warm and dry. No rash noted.   Cardiovascular: Normal heart rate noted  Respiratory: Normal respiratory effort, no problems with respiration noted  Abdomen: Soft, gravid, appropriate for gestational age. Pain/Pressure: Absent     Pelvic: Vag. Bleeding: None     Cervical exam deferred        Extremities: Normal range of motion.  Edema: Trace  Mental Status: Normal mood and affect. Normal behavior. Normal judgment and thought content.   Urinalysis: Urine Protein: Negative Urine Glucose: Negative  Assessment and Plan:  Pregnancy: G1P0000 at 2815w6d  1. Supervision of high risk pregnancy, antepartum, third trimester FHT, FH normal  2. Chronic hypertension during pregnancy, antepartum Normal on recheck.  Needs US for growth Should have been in NST starting at 32 weeks.  This was noticed that she was not in testing until after she left.  Will call patient and arrange for follow up.  Preterm labor symptoms and general obstetric precautions including but not limited to vaginal bleeding, contractions, leaking of fluid and fetal movement were reviewed in detail with the patient. Please refer to After Visit Summary for other  counseling recommendations.  Return in about 2 weeks (around 11/07/2015).   Levie HeritageJacob J Elodia Haviland, DO

## 2015-10-24 NOTE — Progress Notes (Signed)
Growth U/S 10/31/15 @ 4p with [facility].

## 2015-10-24 NOTE — Telephone Encounter (Signed)
1205p- Pt called and stated that she is returning call to Diane to please have her callback.

## 2015-10-24 NOTE — Patient Instructions (Signed)

## 2015-10-24 NOTE — Telephone Encounter (Addendum)
Called pt and left message stating that we need to speak to her regarding additional appointments needed. This was realized after she left clinic today. Please call back and state whether we can leave detailed information on her voice mail.  **Pt needs to be informed of need for twice weekly fetal testing due to hypertension. Appointments have been scheduled - see appt list.   11/11  0920  Spoke w/pt and advised of need for twice weekly appts and fetal testing beginning next week. 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 Appts scheduled based on her request.

## 2015-10-28 ENCOUNTER — Other Ambulatory Visit: Payer: Medicaid Other

## 2015-10-29 ENCOUNTER — Ambulatory Visit (INDEPENDENT_AMBULATORY_CARE_PROVIDER_SITE_OTHER): Payer: Medicaid Other | Admitting: *Deleted

## 2015-10-29 VITALS — BP 129/75 | HR 92

## 2015-10-29 DIAGNOSIS — O10913 Unspecified pre-existing hypertension complicating pregnancy, third trimester: Secondary | ICD-10-CM

## 2015-10-29 NOTE — Progress Notes (Signed)
Pt denies H/A or visual disturbances.  

## 2015-10-31 ENCOUNTER — Ambulatory Visit (INDEPENDENT_AMBULATORY_CARE_PROVIDER_SITE_OTHER): Payer: Medicaid Other | Admitting: *Deleted

## 2015-10-31 ENCOUNTER — Ambulatory Visit (HOSPITAL_COMMUNITY)
Admission: RE | Admit: 2015-10-31 | Discharge: 2015-10-31 | Disposition: A | Discharge status: Home / Self Care | Payer: Medicaid Other | Source: Ambulatory Visit | Attending: Family Medicine | Admitting: Family Medicine

## 2015-10-31 VITALS — BP 130/66 | HR 76

## 2015-10-31 DIAGNOSIS — O10019 Pre-existing essential hypertension complicating pregnancy, unspecified trimester: Secondary | ICD-10-CM | POA: Insufficient documentation

## 2015-10-31 DIAGNOSIS — O10913 Unspecified pre-existing hypertension complicating pregnancy, third trimester: Secondary | ICD-10-CM | POA: Diagnosis present

## 2015-10-31 DIAGNOSIS — Z3A34 34 weeks gestation of pregnancy: Secondary | ICD-10-CM | POA: Insufficient documentation

## 2015-10-31 DIAGNOSIS — O352XX0 Maternal care for (suspected) hereditary disease in fetus, not applicable or unspecified: Secondary | ICD-10-CM | POA: Diagnosis not present

## 2015-10-31 DIAGNOSIS — O10919 Unspecified pre-existing hypertension complicating pregnancy, unspecified trimester: Secondary | ICD-10-CM

## 2015-10-31 NOTE — Progress Notes (Signed)
US for growth today 

## 2015-11-01 NOTE — Progress Notes (Signed)
NST reviewed and reactive.  

## 2015-11-04 ENCOUNTER — Other Ambulatory Visit: Payer: Medicaid Other

## 2015-11-05 ENCOUNTER — Ambulatory Visit (HOSPITAL_COMMUNITY): Payer: Medicaid Other

## 2015-11-05 ENCOUNTER — Other Ambulatory Visit: Payer: Medicaid Other

## 2015-11-05 NOTE — Progress Notes (Signed)
NST reactive on 10/29/15

## 2015-11-06 ENCOUNTER — Other Ambulatory Visit: Payer: Medicaid Other

## 2015-11-11 ENCOUNTER — Ambulatory Visit (INDEPENDENT_AMBULATORY_CARE_PROVIDER_SITE_OTHER): Payer: Medicaid Other | Admitting: Obstetrics & Gynecology

## 2015-11-11 ENCOUNTER — Encounter: Payer: Medicaid Other | Admitting: Obstetrics & Gynecology

## 2015-11-11 ENCOUNTER — Other Ambulatory Visit (HOSPITAL_COMMUNITY)
Admission: RE | Admit: 2015-11-11 | Discharge: 2015-11-11 | Disposition: A | Discharge status: Home / Self Care | Payer: Medicaid Other | Source: Ambulatory Visit | Attending: Obstetrics & Gynecology | Admitting: Obstetrics & Gynecology

## 2015-11-11 VITALS — BP 139/86 | HR 98 | Temp 98.3°F

## 2015-11-11 DIAGNOSIS — Z113 Encounter for screening for infections with a predominantly sexual mode of transmission: Secondary | ICD-10-CM | POA: Insufficient documentation

## 2015-11-11 DIAGNOSIS — O10919 Unspecified pre-existing hypertension complicating pregnancy, unspecified trimester: Secondary | ICD-10-CM

## 2015-11-11 DIAGNOSIS — O10913 Unspecified pre-existing hypertension complicating pregnancy, third trimester: Secondary | ICD-10-CM

## 2015-11-11 LAB — OB RESULTS CONSOLE GBS: GBS: NEGATIVE

## 2015-11-11 LAB — POCT URINALYSIS DIP (DEVICE)
Bilirubin Urine: NEGATIVE
Glucose, UA: NEGATIVE mg/dL
Hgb urine dipstick: NEGATIVE
Ketones, ur: NEGATIVE mg/dL
NITRITE: NEGATIVE
PH: 7 (ref 5.0–8.0)
PROTEIN: NEGATIVE mg/dL
Specific Gravity, Urine: 1.015 (ref 1.005–1.030)
Urobilinogen, UA: 0.2 mg/dL (ref 0.0–1.0)

## 2015-11-11 NOTE — Progress Notes (Signed)
Subjective:  Rebecca Sullivan is a 24 y.o. G1P0000 at 3043w3d being seen today for ongoing prenatal care.  She is currently monitored for the following issues for this high risk pregnancy and has Family history of congenital heart disease; Supervision of high risk pregnancy, antepartum; History of scoliosis; Obesity affecting pregnancy, antepartum; Obesity; Rubella non-immune status, antepartum; Susceptible to varicella (non-immune), currently pregnant; and Chronic hypertension during pregnancy, antepartum on her problem list.  Patient reports no complaints.  Contractions: Not present. Vag. Bleeding: None.  Movement: Present. Denies leaking of fluid.   The following portions of the patient's history were reviewed and updated as appropriate: allergies, current medications, past family history, past medical history, past social history, past surgical history and problem list. Problem list updated.  Objective:   Filed Vitals:   11/11/15 1103  BP: 139/86  Pulse: 98  Temp: 98.3 F (36.8 C)    Fetal Status: Fetal Heart Rate (bpm): RNST   Movement: Present     General:  Alert, oriented and cooperative. Patient is in no acute distress.  Skin: Skin is warm and dry. No rash noted.   Cardiovascular: Normal heart rate noted  Respiratory: Normal respiratory effort, no problems with respiration noted  Abdomen: Soft, gravid, appropriate for gestational age. Pain/Pressure: Absent     Pelvic: Vag. Bleeding: None     Cervical exam performed        Extremities: Normal range of motion.     Mental Status: Normal mood and affect. Normal behavior. Normal judgment and thought content.   Urinalysis: Urine Protein: Negative Urine Glucose: Negative  Assessment and Plan:  Pregnancy: G1P0000 at 2643w3d  1. Chronic hypertension during pregnancy, antepartum -BP nml today -NST reactive -Serial growth US - GC/Chlamydia probe amp ([place] [facility] - Culture, Grp B Strep w/Rflx Suscept - Fetal nonstress  test - US MFM OB FOLLOW UP; Future  Term labor symptoms and general obstetric precautions including but not limited to vaginal bleeding, contractions, leaking of fluid and fetal movement were reviewed in detail with the patient. Please refer to After Visit Summary for other counseling recommendations.  Return in about 1 week (around 11/18/2015).   Lesly DukesKelly H Maiah Sinning, MD

## 2015-11-11 NOTE — Progress Notes (Signed)
Growth U/S 11/26/15 @ 215p with [facility].

## 2015-11-12 LAB — GC/CHLAMYDIA PROBE AMP (~~LOC~~) NOT AT ARMC
CHLAMYDIA, DNA PROBE: NEGATIVE
Neisseria Gonorrhea: NEGATIVE

## 2015-11-13 LAB — CULTURE, STREPTOCOCCUS GRP B W/SUSCEPT

## 2015-11-14 ENCOUNTER — Ambulatory Visit (INDEPENDENT_AMBULATORY_CARE_PROVIDER_SITE_OTHER): Payer: Medicaid Other | Admitting: General Practice

## 2015-11-14 VITALS — BP 144/83 | HR 101

## 2015-11-14 DIAGNOSIS — O163 Unspecified maternal hypertension, third trimester: Secondary | ICD-10-CM

## 2015-11-14 NOTE — Progress Notes (Signed)
NST reviewed and reactive.  

## 2015-11-15 ENCOUNTER — Inpatient Hospital Stay (HOSPITAL_COMMUNITY)
Admission: AD | Admit: 2015-11-15 | Discharge: 2015-11-19 | DRG: 775 | Disposition: A | Discharge status: Home / Self Care | Payer: Medicaid Other | Source: Ambulatory Visit | Attending: Obstetrics & Gynecology | Admitting: Obstetrics & Gynecology

## 2015-11-15 ENCOUNTER — Encounter (HOSPITAL_COMMUNITY): Payer: Self-pay | Admitting: Family Medicine

## 2015-11-15 ENCOUNTER — Encounter (HOSPITAL_COMMUNITY): Payer: Self-pay

## 2015-11-15 ENCOUNTER — Ambulatory Visit (HOSPITAL_COMMUNITY): Payer: Medicaid Other

## 2015-11-15 ENCOUNTER — Ambulatory Visit (HOSPITAL_COMMUNITY)
Admission: RE | Admit: 2015-11-15 | Discharge: 2015-11-15 | Disposition: A | Discharge status: Home / Self Care | Payer: Medicaid Other | Source: Ambulatory Visit | Attending: Obstetrics & Gynecology | Admitting: Obstetrics & Gynecology

## 2015-11-15 DIAGNOSIS — O10913 Unspecified pre-existing hypertension complicating pregnancy, third trimester: Secondary | ICD-10-CM

## 2015-11-15 DIAGNOSIS — O4103X0 Oligohydramnios, third trimester, not applicable or unspecified: Secondary | ICD-10-CM | POA: Diagnosis present

## 2015-11-15 DIAGNOSIS — O10919 Unspecified pre-existing hypertension complicating pregnancy, unspecified trimester: Secondary | ICD-10-CM | POA: Diagnosis present

## 2015-11-15 DIAGNOSIS — Z6834 Body mass index (BMI) 34.0-34.9, adult: Secondary | ICD-10-CM

## 2015-11-15 DIAGNOSIS — O99214 Obesity complicating childbirth: Secondary | ICD-10-CM | POA: Diagnosis present

## 2015-11-15 DIAGNOSIS — O4100X0 Oligohydramnios, unspecified trimester, not applicable or unspecified: Secondary | ICD-10-CM | POA: Diagnosis present

## 2015-11-15 DIAGNOSIS — Z3A37 37 weeks gestation of pregnancy: Secondary | ICD-10-CM

## 2015-11-15 DIAGNOSIS — O9989 Other specified diseases and conditions complicating pregnancy, childbirth and the puerperium: Secondary | ICD-10-CM

## 2015-11-15 DIAGNOSIS — O134 Gestational [pregnancy-induced] hypertension without significant proteinuria, complicating childbirth: Secondary | ICD-10-CM | POA: Diagnosis present

## 2015-11-15 DIAGNOSIS — O9962 Diseases of the digestive system complicating childbirth: Secondary | ICD-10-CM | POA: Diagnosis present

## 2015-11-15 DIAGNOSIS — O0990 Supervision of high risk pregnancy, unspecified, unspecified trimester: Secondary | ICD-10-CM

## 2015-11-15 DIAGNOSIS — J45909 Unspecified asthma, uncomplicated: Secondary | ICD-10-CM | POA: Diagnosis present

## 2015-11-15 DIAGNOSIS — E669 Obesity, unspecified: Secondary | ICD-10-CM | POA: Diagnosis present

## 2015-11-15 DIAGNOSIS — O9902 Anemia complicating childbirth: Secondary | ICD-10-CM | POA: Diagnosis present

## 2015-11-15 DIAGNOSIS — IMO0001 Reserved for inherently not codable concepts without codable children: Secondary | ICD-10-CM

## 2015-11-15 DIAGNOSIS — O163 Unspecified maternal hypertension, third trimester: Secondary | ICD-10-CM

## 2015-11-15 DIAGNOSIS — K219 Gastro-esophageal reflux disease without esophagitis: Secondary | ICD-10-CM | POA: Diagnosis present

## 2015-11-15 DIAGNOSIS — O9952 Diseases of the respiratory system complicating childbirth: Secondary | ICD-10-CM | POA: Diagnosis present

## 2015-11-15 DIAGNOSIS — Z283 Underimmunization status: Secondary | ICD-10-CM

## 2015-11-15 DIAGNOSIS — O133 Gestational [pregnancy-induced] hypertension without significant proteinuria, third trimester: Secondary | ICD-10-CM

## 2015-11-15 DIAGNOSIS — O09899 Supervision of other high risk pregnancies, unspecified trimester: Secondary | ICD-10-CM | POA: Diagnosis present

## 2015-11-15 LAB — COMPREHENSIVE METABOLIC PANEL
ALBUMIN: 3 g/dL — AB (ref 3.5–5.0)
ALT: 12 U/L — AB (ref 14–54)
AST: 19 U/L (ref 15–41)
Alkaline Phosphatase: 148 U/L — ABNORMAL HIGH (ref 38–126)
Anion gap: 10 (ref 5–15)
BILIRUBIN TOTAL: 0.2 mg/dL — AB (ref 0.3–1.2)
BUN: 9 mg/dL (ref 6–20)
CHLORIDE: 105 mmol/L (ref 101–111)
CO2: 21 mmol/L — ABNORMAL LOW (ref 22–32)
CREATININE: 0.53 mg/dL (ref 0.44–1.00)
Calcium: 9.4 mg/dL (ref 8.9–10.3)
GFR calc Af Amer: >60 mL/min (ref >60)
GFR calc non Af Amer: >60 mL/min (ref >60)
GLUCOSE: 106 mg/dL — AB (ref 65–99)
POTASSIUM: 3.8 mmol/L (ref 3.5–5.1)
Sodium: 136 mmol/L (ref 135–145)
Total Protein: 6.1 g/dL — ABNORMAL LOW (ref 6.5–8.1)

## 2015-11-15 LAB — CBC
HEMATOCRIT: 34.7 % — AB (ref 36.0–46.0)
HEMOGLOBIN: 11.2 g/dL — AB (ref 12.0–15.0)
MCH: 24.4 pg — ABNORMAL LOW (ref 26.0–34.0)
MCHC: 32.3 g/dL (ref 30.0–36.0)
MCV: 75.6 fL — AB (ref 78.0–100.0)
Platelets: 256 10*3/uL (ref 150–400)
RBC: 4.59 MIL/uL (ref 3.87–5.11)
RDW: 15.1 % (ref 11.5–15.5)
WBC: 17.4 10*3/uL — ABNORMAL HIGH (ref 4.0–10.5)

## 2015-11-15 LAB — TYPE AND SCREEN
ABO/RH(D): A POS
ANTIBODY SCREEN: NEGATIVE

## 2015-11-15 LAB — ABO/RH: ABO/RH(D): A POS

## 2015-11-15 MED ORDER — CITRIC ACID-SODIUM CITRATE 334-500 MG/5ML PO SOLN
30.0000 mL | ORAL | Status: DC | PRN
  Administered 2015-11-16: 30 mL via ORAL
  Filled 2015-11-15: qty 15

## 2015-11-15 MED ORDER — ACETAMINOPHEN 325 MG PO TABS
650.0000 mg | ORAL_TABLET | ORAL | Status: DC | PRN
  Administered 2015-11-16: 650 mg via ORAL
  Filled 2015-11-15: qty 2

## 2015-11-15 MED ORDER — LABETALOL HCL 5 MG/ML IV SOLN
20.0000 mg | INTRAVENOUS | Status: DC | PRN
Start: 2015-11-15 — End: 2015-11-17
  Filled 2015-11-15: qty 4

## 2015-11-15 MED ORDER — MISOPROSTOL 25 MCG QUARTER TABLET
25.0000 ug | ORAL_TABLET | ORAL | Status: DC | PRN
  Administered 2015-11-15 – 2015-11-16 (×4): 25 ug via VAGINAL
  Filled 2015-11-15: qty 0.25
  Filled 2015-11-15: qty 1
  Filled 2015-11-15 (×3): qty 0.25

## 2015-11-15 MED ORDER — CALCIUM CARBONATE ANTACID 500 MG PO CHEW: 1.0000 | CHEWABLE_TABLET | Freq: Three times a day (TID) | ORAL | Status: DC | PRN

## 2015-11-15 MED ORDER — OXYTOCIN BOLUS FROM INFUSION: 500.0000 mL | INTRAVENOUS | Status: DC

## 2015-11-15 MED ORDER — LABETALOL HCL 200 MG PO TABS
200.0000 mg | ORAL_TABLET | Freq: Two times a day (BID) | ORAL | Status: DC
  Administered 2015-11-15: 200 mg via ORAL
  Filled 2015-11-15 (×4): qty 1

## 2015-11-15 MED ORDER — OXYCODONE-ACETAMINOPHEN 5-325 MG PO TABS: 2.0000 | ORAL_TABLET | ORAL | Status: DC | PRN

## 2015-11-15 MED ORDER — LACTATED RINGERS IV SOLN
INTRAVENOUS | Status: DC
  Administered 2015-11-15: 18:00:00 via INTRAVENOUS

## 2015-11-15 MED ORDER — LACTATED RINGERS IV SOLN
500.0000 mL | INTRAVENOUS | Status: DC | PRN
  Administered 2015-11-16: 1000 mL via INTRAVENOUS
  Administered 2015-11-16 – 2015-11-17 (×2): 500 mL via INTRAVENOUS

## 2015-11-15 MED ORDER — HYDRALAZINE HCL 20 MG/ML IJ SOLN: 10.0000 mg | Freq: Once | INTRAMUSCULAR | Status: DC | PRN

## 2015-11-15 MED ORDER — ONDANSETRON HCL 4 MG/2ML IJ SOLN
4.0000 mg | Freq: Four times a day (QID) | INTRAMUSCULAR | Status: DC | PRN
  Administered 2015-11-16: 4 mg via INTRAVENOUS
  Filled 2015-11-15: qty 2

## 2015-11-15 MED ORDER — LIDOCAINE HCL (PF) 1 % IJ SOLN
30.0000 mL | INTRAMUSCULAR | Status: DC | PRN
  Filled 2015-11-15: qty 30

## 2015-11-15 MED ORDER — OXYCODONE-ACETAMINOPHEN 5-325 MG PO TABS: 1.0000 | ORAL_TABLET | ORAL | Status: DC | PRN

## 2015-11-15 MED ORDER — OXYTOCIN 40 UNITS IN LACTATED RINGERS INFUSION - SIMPLE MED
62.5000 mL/h | INTRAVENOUS | Status: DC
  Administered 2015-11-17: 999 mL/h via INTRAVENOUS
  Filled 2015-11-15: qty 1000

## 2015-11-15 MED ORDER — TERBUTALINE SULFATE 1 MG/ML IJ SOLN
0.2500 mg | Freq: Once | INTRAMUSCULAR | Status: DC | PRN
Start: 2015-11-15 — End: 2015-11-17
  Filled 2015-11-15: qty 1

## 2015-11-15 MED ORDER — ZOLPIDEM TARTRATE 5 MG PO TABS
5.0000 mg | ORAL_TABLET | Freq: Every evening | ORAL | Status: DC | PRN
  Administered 2015-11-16: 5 mg via ORAL
  Filled 2015-11-15: qty 1

## 2015-11-15 NOTE — H&P (Signed)
Rebecca Sullivan is a 24 y.o. female presenting for Induction of Labor for Oligohydramnios and hypertension, worsening.  She has had elevated BPs since  14 weeks.   Maternal Medical History:  Reason for admission: Nausea. Induction of labor for oligohydramnios (AFI=4) and worsening hypertension  Contractions: Frequency: rare.   Perceived severity is mild.    Fetal activity: Perceived fetal activity is normal.   Last perceived fetal movement was within the past hour.    Prenatal complications: PIH and oligohydramnios.   No bleeding, infection, IUGR, placental abnormality, polyhydramnios or preterm labor.   Prenatal Complications - Diabetes: none.    OB History    Gravida Para Term Preterm AB TAB SAB Ectopic Multiple Living       Past Medical History  Diagnosis Date  . Asthma    Past Surgical History  Procedure Laterality Date  . Back surgery  2009    Has two rods and 22 screws from cervical to mid thoracid   Family History: family history is not on file. Social History:  reports that she has never smoked. She does not have any smokeless tobacco history on file. She reports that she does not drink alcohol or use illicit drugs.   Prenatal Transfer Tool  Maternal Diabetes: No Genetic Screening: Normal Maternal Ultrasounds/Referrals: Normal Fetal Ultrasounds or other Referrals:  Other:  [place] today showed BPP 8/8 with Oligohydramnios Maternal Substance Abuse:  No Significant Maternal Medications:  None Significant Maternal Lab Results:  None Other Comments:  None  Review of Systems  Constitutional: Negative for fever, chills and malaise/fatigue.  Eyes: Negative for blurred vision and double vision.  Gastrointestinal: Negative for nausea, vomiting, abdominal pain, diarrhea and constipation.  Musculoskeletal: Negative for back pain.  Neurological: Negative for dizziness, focal weakness and headaches.    Dilation: Fingertip Effacement (%):  50 Station: -2 Exam by:: Lajuana Matte RN Blood pressure 159/97, pulse 98, temperature 98.3 F (36.8 C), temperature source Oral, resp. rate 18, last menstrual period 02/12/2015. Maternal Exam:  Uterine Assessment: Contraction strength is mild.  Contraction frequency is rare.   Abdomen: Patient reports no abdominal tenderness. Fundal height is 37.   Estimated fetal weight is 7.   Fetal presentation: vertex  Introitus: Normal vulva. Normal vagina.  Vagina is negative for discharge.  Ferning test: not done.  Nitrazine test: not done. Amniotic fluid character: not assessed.  Pelvis: adequate for delivery.   Cervix: Cervix evaluated by digital exam.     Fetal Exam Fetal Monitor Review: Mode: ultrasound.   Baseline rate: 145.  Variability: moderate (6-25 bpm).   Pattern: accelerations present and no decelerations.    Fetal State Assessment: Category I - tracings are normal.     Filed Vitals:   11/15/15 1711 11/15/15 1730 11/15/15 1737  BP: 150/96  159/97  Pulse: 98  98  Temp: 98.3 F (36.8 C)    TempSrc: Oral    Resp: 18  18  Height:  6' (1.829 m)   Weight:  114.76 kg (253 lb)     Physical Exam  Constitutional: She is oriented to person, place, and time. She appears well-developed and well-nourished. No distress.  HENT:  Head: Normocephalic.  Neck: Normal range of motion.  Cardiovascular: Normal rate, regular rhythm and normal heart sounds.  Exam reveals no gallop and no friction rub.   No murmur heard. Respiratory: Effort normal and breath sounds normal. No respiratory distress. She has no wheezes. She has  no rales. She exhibits no tenderness.  GI: Soft. She exhibits no distension. There is no tenderness. There is no rebound and no guarding.  Genitourinary: Vagina normal. No vaginal discharge found.  Dilation: Fingertip Effacement (%): 50 Station: -2 Presentation: Vertex Exam by:: Lajuana MatteJacobs, Dina RN   Musculoskeletal: Normal range of motion. She exhibits edema  (trace pedal).  Neurological: She is alert and oriented to person, place, and time. She has normal reflexes. She displays normal reflexes. She exhibits normal muscle tone (no clonus). Coordination normal.  Skin: Skin is warm and dry.  Psychiatric: She has a normal mood and affect.    Prenatal labs: ABO, Rh: A/Positive/-- (06/02 0000) Antibody: Negative (06/02 0000) Rubella: Nonimmune (06/02 0000) RPR: NON REAC (09/29 0914)  HBsAg: Negative (06/02 0000)  HIV: NONREACTIVE (09/29 0914)  GBS:     Assessment/Plan: SIUP at 3644w0d  Oligohydramnios with normal growth and normal BPP Chronic hypertension with worsening pressures today, r/o preeclampsia Unfavorable cervix  P[facility]       Routine orders       Induction of Labor with Cytotec   [facility]line Asc IncWILLIAMS,MARIE 11/15/2015, 5:52 PM

## 2015-11-15 NOTE — Anesthesia Preprocedure Evaluation (Addendum)
Anesthesia Evaluation  Patient identified by MRN, date of birth, ID band Patient awake    Reviewed: Allergy & Precautions, Patient's Chart, lab work & pertinent test results  Airway Mallampati: II  TM Distance: >3 FB Neck ROM: Full    Dental no notable dental hx. (+) Teeth Intact   Pulmonary asthma ,    Pulmonary exam normal breath sounds clear to auscultation       Cardiovascular hypertension, Normal cardiovascular exam Rhythm:Regular Rate:Normal     Neuro/Psych negative neurological ROS  negative psych ROS   GI/Hepatic Neg liver ROS, GERD  Medicated and Controlled,  Endo/Other  Obesity  Renal/GU negative Renal ROS  negative genitourinary   Musculoskeletal Hx/o Scoliosis surgery at [facility] 8 years ago with Rods and hardware placed   Abdominal (+) + obese,   Peds  Hematology  (+) anemia ,   Anesthesia Other Findings She has a long scar stretching from her upper Thoracic area to her lower lumbar area. Spinous interspaces are palpable.  Reproductive/Obstetrics (+) Pregnancy IUP 37 weeks Oligohydramnios Elevated BP's                            Anesthesia Physical Anesthesia Plan  ASA: II  Anesthesia Plan: Epidural   Post-op Pain Management:    Induction:   Airway Management Planned: Natural Airway  Additional Equipment:   Intra-op Plan:   Post-operative Plan:   Informed Consent: I have reviewed the patients History and Physical, chart, labs and discussed the procedure including the risks, benefits and alternatives for the proposed anesthesia with the patient or authorized representative who has indicated his/her understanding and acceptance.     Plan Discussed with: Anesthesiologist  Anesthesia Plan Comments: (I discussed in detail the procedure and the possibility of incomplete or failed block, wet tap, PDPHA, nerve damage, hypotension, infection, bleeding and allergic or toxic  reactions to medications. She understands and will request epidural placement when she starts feeling discomfort.)        Anesthesia Quick Evaluation

## 2015-11-15 NOTE — Progress Notes (Signed)
Rebecca Sullivan is a 24 y.o. G1P0000 at [redacted]w[redacted]d  admitted for induction of labor due to Low amniotic fluid. and Hypertension.  Subjective: Mostly comfortable; just had second cytotec dose placed; receiving Lab 200 BID  Objective: BP 154/88 mmHg  Pulse 83  Temp(Src) 98.4 F (36.9 C) (Oral)  Resp 20  Ht 6' (1.829 m)  Wt 114.76 kg (253 lb)  BMI 34.31 kg/m2  LMP 02/12/2015      FHT:  FHR: 140s bpm, variability: moderate,  accelerations:  Present,  decelerations:  Absent UC:   irreg SVE:   Dilation: 1 Effacement (%): 70 Station: -3 Exam by:: Rhea PinkVivian Grice, RNC  Labs: Lab Results  Component Value Date   WBC 17.4* 11/15/2015   HGB 11.2* 11/15/2015   HCT 34.7* 11/15/2015   MCV 75.6* 11/15/2015   PLT 256 11/15/2015   CMP     Component Value Date/Time   NA 136 11/15/2015 1720   K 3.8 11/15/2015 1720   CL 105 11/15/2015 1720   CO2 21* 11/15/2015 1720   GLUCOSE 106* 11/15/2015 1720   BUN 9 11/15/2015 1720   CREATININE 0.53 11/15/2015 1720   CALCIUM 9.4 11/15/2015 1720   PROT 6.1* 11/15/2015 1720   ALBUMIN 3.0* 11/15/2015 1720   AST 19 11/15/2015 1720   ALT 12* 11/15/2015 1720   ALKPHOS 148* 11/15/2015 1720   BILITOT 0.2* 11/15/2015 1720   GFRNONAA >60 11/15/2015 1720   GFRAA >60 11/15/2015 1720    Assessment / Plan: IUP@25 .0wks cHTN on Lab Oligo w/ nl growth  Will continue w/ cytotec during the night, and attempt foley bulb in the morning Pre-e bloodwork nl, still waiting for P/C ratio  SHAW, KIMBERLY CNM 11/15/2015, 11:15 PM

## 2015-11-15 NOTE — ED Notes (Signed)
Pt taken to MAU to register for induction in room 163.

## 2015-11-15 NOTE — Progress Notes (Signed)
Continuous FHR surveillance interrupted, RN to bedside, EFM adjusted.  

## 2015-11-16 ENCOUNTER — Inpatient Hospital Stay (HOSPITAL_COMMUNITY): Payer: Medicaid Other | Admitting: Anesthesiology

## 2015-11-16 LAB — RPR: RPR: NONREACTIVE

## 2015-11-16 LAB — PROTEIN / CREATININE RATIO, URINE
CREATININE, URINE: 80 mg/dL
PROTEIN CREATININE RATIO: 0.09 mg/mg{creat} (ref 0.00–0.15)
TOTAL PROTEIN, URINE: 7 mg/dL

## 2015-11-16 MED ORDER — EPHEDRINE 5 MG/ML INJ
10.0000 mg | INTRAVENOUS | Status: DC | PRN
  Filled 2015-11-16: qty 2

## 2015-11-16 MED ORDER — DIPHENHYDRAMINE HCL 50 MG/ML IJ SOLN: 12.5000 mg | INTRAMUSCULAR | Status: DC | PRN

## 2015-11-16 MED ORDER — OXYTOCIN 40 UNITS IN LACTATED RINGERS INFUSION - SIMPLE MED
1.0000 m[IU]/min | INTRAVENOUS | Status: DC
  Administered 2015-11-16: 2 m[IU]/min via INTRAVENOUS

## 2015-11-16 MED ORDER — FENTANYL 2.5 MCG/ML BUPIVACAINE 1/10 % EPIDURAL INFUSION (WH - ANES)
14.0000 mL/h | INTRAMUSCULAR | Status: DC | PRN
  Administered 2015-11-16 (×2): 14 mL/h via EPIDURAL
  Filled 2015-11-16: qty 125

## 2015-11-16 MED ORDER — FENTANYL CITRATE (PF) 100 MCG/2ML IJ SOLN
100.0000 ug | INTRAMUSCULAR | Status: DC | PRN
  Administered 2015-11-16: 100 ug via INTRAVENOUS
  Filled 2015-11-16: qty 2

## 2015-11-16 MED ORDER — PHENYLEPHRINE 40 MCG/ML (10ML) SYRINGE FOR IV PUSH (FOR BLOOD PRESSURE SUPPORT)
80.0000 ug | PREFILLED_SYRINGE | INTRAVENOUS | Status: DC | PRN
  Filled 2015-11-16: qty 20
  Filled 2015-11-16: qty 2

## 2015-11-16 MED ORDER — LIDOCAINE HCL (PF) 1 % IJ SOLN
INTRAMUSCULAR | Status: DC | PRN
  Administered 2015-11-16 (×2): 5 mL
  Administered 2015-11-16: 3 mL

## 2015-11-16 NOTE — Anesthesia Procedure Notes (Signed)
Epidural Patient location during procedure: OB  Staffing Anesthesiologist: Phillips GroutARIGNAN, Charlcie Prisco Performed by: anesthesiologist   Preanesthetic Checklist Completed: patient identified, site marked, surgical consent, pre-op evaluation, timeout performed, IV checked, risks and benefits discussed and monitors and equipment checked  Epidural Patient position: sitting Prep: DuraPrep Patient monitoring: heart rate, continuous pulse ox and blood pressure Approach: right paramedian Location: L5-S1 Injection technique: LOR saline  Needle:  Needle type: Tuohy  Needle gauge: 17 G Needle length: 9 cm and 9 Needle insertion depth: 7 cm Catheter type: closed end flexible Catheter size: 20 Guage Catheter at skin depth: 12 cm Test dose: negative  Assessment Events: blood not aspirated, injection not painful, no injection resistance, negative IV test and no paresthesia  Additional Notes Patient identified. Risks/Benefits/Options discussed with patient including but not limited to bleeding, infection, nerve damage, paralysis, failed block, incomplete pain control, headache, blood pressure changes, nausea, vomiting, reactions to medication both or allergic, itching and postpartum back pain. Confirmed with bedside nurse the patient's most recent platelet count. Confirmed with patient that they are not currently taking any anticoagulation, have any bleeding history or any family history of bleeding disorders. Patient expressed understanding and wished to proceed. All questions were answered. Sterile technique was used throughout the entire procedure. Please see nursing notes for vital signs. Test dose was given through epidural needle and negative prior to continuing to dose epidural or start infusion. Warning signs of high block given to the patient including shortness of breath, tingling/numbness in hands, complete motor block, or any concerning symptoms with instructions to call for help. Patient was given  instructions on fall risk and not to get out of bed. All questions and concerns addressed with instructions to call with any issues.

## 2015-11-16 NOTE — Progress Notes (Signed)
Labor Progress Note Trinady Milewski is a 24 y.o. G1P0000 at 31w1dpresented for IOL for oligohydramnios S: patient is nervous about FB placement but agrees to placement now that FOB and mother have returned  O:  BP 130/63 mmHg  Pulse 75  Temp(Src) 98.5 F (36.9 C) (Oral)  Resp 18  Ht 6' (1.829 m)  Wt 253 lb (114.76 kg)  BMI 34.31 kg/m2  LMP 02/12/2015 EFM: 140/mod/+accels  CVE: Dilation: 1.5 Effacement (%): 50 Cervical Position: Middle Station: Ballotable Presentation: Vertex Exam by:: NLubertha SayresMD  FB placed easily and filled with 60 cc  A&P: 24y.o. G1P0000 372w1dere for IOL due to gHTN with Oligohydramnios #Labor: FB placed. Cytotec q4 hours. Pitocin when FB out.  #Pain: IV pain meds prn #FWB: Cat I #GBS Neg  #PP vaccinations: needs MMR and then varicella outpatient  KiCaren MacadamMD 11:21 AM

## 2015-11-16 NOTE — Progress Notes (Signed)
RN to bedside, pt assisted to BR. Cat I FHR surveillance prior to removal of EFM.    

## 2015-11-16 NOTE — Progress Notes (Signed)
Labor Progress Note Rebecca Sullivan is a 24 y.o. G1P0000 at 58w1dpresented for IOL for oligohydramnios  S: FB out, Pitocin started _0   O:  BP 140/78 mmHg  Pulse 102  Temp(Src) 98.5 F (36.9 C) (Oral)  Resp 18  Ht 6' (1.829 m)  Wt 253 lb (114.76 kg)  BMI 34.31 kg/m2  LMP 02/12/2015 EFM: 140/mod/+accels/no decels  CVE: Dilation: 5.5 Effacement (%): 70 Cervical Position: Middle Station: -3 Presentation: Vertex Exam by:: NLubertha SayresMD   A&P: 24y.o. G1P0000 383w1dere for IOL due to gHTN with Oligohydramnios #Labor:  Progressing expectedly. Approaching active labor. Continue pitocin 34m32mnd titrate appropriately to q2-3 min contractions.  #Pain: IV pain meds prn. Epidural prn #FWB: Cat I #GBS Neg  #PP vaccinations: needs MMR and then varicella outpatient  KimCaren MacadamD 5:59 PM

## 2015-11-16 NOTE — Progress Notes (Signed)
Continuous FHR surveillance interrupted, RN to bedside, EFM adjusted.  

## 2015-11-16 NOTE — Progress Notes (Signed)
Dr. Montel CulverKerrigan of anesthesia to bedside, informed consent presented including risks and benefits. Pt agrees to procedure, consent signed, pt assisted to sitting position on side of bed. Cat I FHR surveillance noted, EFM removed for procedure.

## 2015-11-16 NOTE — Progress Notes (Signed)
Report given and care relinquished to [place]ina, [place].

## 2015-11-17 ENCOUNTER — Encounter (HOSPITAL_COMMUNITY): Payer: Self-pay | Admitting: Student

## 2015-11-17 DIAGNOSIS — O134 Gestational [pregnancy-induced] hypertension without significant proteinuria, complicating childbirth: Secondary | ICD-10-CM

## 2015-11-17 DIAGNOSIS — O4103X0 Oligohydramnios, third trimester, not applicable or unspecified: Secondary | ICD-10-CM

## 2015-11-17 DIAGNOSIS — Z3A37 37 weeks gestation of pregnancy: Secondary | ICD-10-CM

## 2015-11-17 MED ORDER — ZOLPIDEM TARTRATE 5 MG PO TABS: 5.0000 mg | ORAL_TABLET | Freq: Every evening | ORAL | Status: DC | PRN

## 2015-11-17 MED ORDER — LABETALOL HCL 200 MG PO TABS
200.0000 mg | ORAL_TABLET | Freq: Two times a day (BID) | ORAL | Status: AC
  Administered 2015-11-17 (×2): 200 mg via ORAL
  Filled 2015-11-17 (×2): qty 1

## 2015-11-17 MED ORDER — TETANUS-DIPHTH-ACELL PERTUSSIS 5-2.5-18.5 LF-MCG/0.5 IM SUSP: 0.5000 mL | Freq: Once | INTRAMUSCULAR | Status: DC

## 2015-11-17 MED ORDER — BENZOCAINE-MENTHOL 20-0.5 % EX AERO
1.0000 "application " | INHALATION_SPRAY | CUTANEOUS | Status: DC | PRN
  Filled 2015-11-17: qty 56

## 2015-11-17 MED ORDER — SIMETHICONE 80 MG PO CHEW: 80.0000 mg | CHEWABLE_TABLET | ORAL | Status: DC | PRN

## 2015-11-17 MED ORDER — PRENATAL MULTIVITAMIN CH
1.0000 | ORAL_TABLET | Freq: Every day | ORAL | Status: DC
  Administered 2015-11-17 – 2015-11-18 (×2): 1 via ORAL
  Filled 2015-11-17 (×2): qty 1

## 2015-11-17 MED ORDER — WITCH HAZEL-GLYCERIN EX PADS
1.0000 | MEDICATED_PAD | CUTANEOUS | Status: DC | PRN
Start: 2015-11-17 — End: 2015-11-19

## 2015-11-17 MED ORDER — ACETAMINOPHEN 325 MG PO TABS: 650.0000 mg | ORAL_TABLET | ORAL | Status: DC | PRN

## 2015-11-17 MED ORDER — OXYTOCIN 40 UNITS IN LACTATED RINGERS INFUSION - SIMPLE MED: 62.5000 mL/h | INTRAVENOUS | Status: DC | PRN

## 2015-11-17 MED ORDER — LANOLIN HYDROUS EX OINT: TOPICAL_OINTMENT | CUTANEOUS | Status: DC | PRN

## 2015-11-17 MED ORDER — DIPHENHYDRAMINE HCL 25 MG PO CAPS: 25.0000 mg | ORAL_CAPSULE | Freq: Four times a day (QID) | ORAL | Status: DC | PRN

## 2015-11-17 MED ORDER — ONDANSETRON HCL 4 MG/2ML IJ SOLN: 4.0000 mg | INTRAMUSCULAR | Status: DC | PRN

## 2015-11-17 MED ORDER — MEASLES, MUMPS & RUBELLA VAC ~~LOC~~ INJ
0.5000 mL | INJECTION | Freq: Once | SUBCUTANEOUS | Status: AC
  Administered 2015-11-19: 0.5 mL via SUBCUTANEOUS
  Filled 2015-11-17 (×2): qty 0.5

## 2015-11-17 MED ORDER — IBUPROFEN 600 MG PO TABS
600.0000 mg | ORAL_TABLET | Freq: Four times a day (QID) | ORAL | Status: DC
  Administered 2015-11-17 – 2015-11-19 (×8): 600 mg via ORAL
  Filled 2015-11-17 (×8): qty 1

## 2015-11-17 MED ORDER — SENNOSIDES-DOCUSATE SODIUM 8.6-50 MG PO TABS
2.0000 | ORAL_TABLET | ORAL | Status: DC
  Administered 2015-11-18 (×2): 2 via ORAL
  Filled 2015-11-17 (×2): qty 2

## 2015-11-17 MED ORDER — ONDANSETRON HCL 4 MG PO TABS: 4.0000 mg | ORAL_TABLET | ORAL | Status: DC | PRN

## 2015-11-17 MED ORDER — AMLODIPINE BESYLATE 10 MG PO TABS
10.0000 mg | ORAL_TABLET | Freq: Every day | ORAL | Status: DC
  Administered 2015-11-17 – 2015-11-18 (×2): 10 mg via ORAL
  Filled 2015-11-17 (×3): qty 1

## 2015-11-17 MED ORDER — DIBUCAINE 1 % RE OINT
1.0000 | TOPICAL_OINTMENT | RECTAL | Status: DC | PRN
Start: 2015-11-17 — End: 2015-11-19

## 2015-11-17 NOTE — Lactation Note (Signed)
This note was copied from the chart of Rebecca Nori RiisBrooke Hincapie. Lactation Consultation Note  Mother states she only wants to formula feed.  Patient Name: Rebecca Sullivan ZOXWR'UToday's Date: 11/17/2015     Maternal Data    Feeding Feeding Type:  (mother states she is going to only bottle feed) Nipple Type: Slow - flow  LATCH Score/Interventions                      Lactation Tools Discussed/Used     Consult Status      Hardie PulleyBerkelhammer, Ruth Boschen 11/17/2015, 11:30 AM

## 2015-11-17 NOTE — Progress Notes (Signed)
RN at bedside, abd palpated and TOCO adjusted. Unable to accurately assess uterine activity when pt in lateral position. MD to be notified.

## 2015-11-17 NOTE — Progress Notes (Signed)
Pt transferred to 136 via stretcher safely. Pt assisted self from stretcher to bed safely. Bedside report given and care relinquished to PaulHolly, [place]RN.

## 2015-11-17 NOTE — Progress Notes (Signed)
RN attempted to transfer pt to BR via stedy. Pt unable to support upper body on stedy. Pt returned to bed safely and stretcher used to transfer pt.

## 2015-11-17 NOTE — Progress Notes (Signed)
Patient's blood pressure elevated on admission to Mother Baby and at her one hour check.  No other symptoms or complaints at either check.  MD notified and orders received for labetalol and Norvasc to start at 10:00 am.  [name] on coming RN notified of new orders.  Pt sleeping at time of rounds.

## 2015-11-17 NOTE — Progress Notes (Signed)
Patient given MMR VIS. 

## 2015-11-17 NOTE — Progress Notes (Signed)
RN remains present at bedside, palpating abd to assess for uterine activity. TOCO adjusted. Strong UC noted every 2-3 min, lasting approx 60 sec.

## 2015-11-17 NOTE — Progress Notes (Signed)
Provider in OR with other pt and unable to assess at bedside to place IUPC.

## 2015-11-17 NOTE — Progress Notes (Signed)
Dr. Ashok PallWouk called for delivery. Pt assisted to modified lithotomy and prepped for delivery.

## 2015-11-17 NOTE — Progress Notes (Signed)
RN remains at bedside, palpated abd, and adjusting TOCO.

## 2015-11-17 NOTE — Progress Notes (Signed)
MD present  

## 2015-11-17 NOTE — Anesthesia Postprocedure Evaluation (Signed)
Anesthesia Post Note  Patient: Rebecca RiisBrooke Sullivan  Procedure(s) Performed: * No procedures listed *  Patient location during evaluation: Mother Baby Anesthesia Type: Epidural Level of consciousness: awake and alert, oriented and patient cooperative Pain management: pain level controlled Vital Signs Assessment: post-procedure vital signs reviewed and stable Respiratory status: spontaneous breathing Cardiovascular status: stable Postop Assessment: no headache, epidural receding, patient able to bend at knees and no signs of nausea or vomiting Anesthetic complications: no    Last Vitals:  Filed Vitals:   11/17/15 0615 11/17/15 0940  BP: 154/64 146/67  Pulse: 57 75  Temp: 37.1 C 37.2 C  Resp: 16 18    Last Pain:  Filed Vitals:   11/17/15 0946  PainSc: 5                  Fady Stamps

## 2015-11-18 NOTE — Progress Notes (Signed)
Post Partum Day 1 Subjective: no complaints, up ad lib, voiding, tolerating PO and + flatus  Objective: Blood pressure 137/68, pulse 65, temperature 97.4 F (36.3 C), temperature source Oral, resp. rate 18, height 6' (1.829 m), weight 253 lb (114.76 kg), last menstrual period 02/12/2015, SpO2 100 %, unknown if currently breastfeeding.  Physical Exam:  General: alert, cooperative, appears stated age and no distress Lochia: appropriate Uterine Fundus: firm Incision: n/a DVT Evaluation: No evidence of DVT seen on physical exam. Negative Homan's sign. No cords or calf tenderness.   Recent Labs  11/15/15 1720  HGB 11.2*  HCT 34.7*    Assessment/Plan: Plan for discharge tomorrow   LOS: 3 days   Filemon Breton DARLENE 11/18/2015, 7:21 AM

## 2015-11-19 ENCOUNTER — Other Ambulatory Visit: Payer: Medicaid Other

## 2015-11-19 DIAGNOSIS — IMO0001 Reserved for inherently not codable concepts without codable children: Secondary | ICD-10-CM

## 2015-11-19 MED ORDER — IBUPROFEN 600 MG PO TABS: 600.0000 mg | ORAL_TABLET | Freq: Four times a day (QID) | ORAL | Status: AC

## 2015-11-19 MED ORDER — AMLODIPINE BESYLATE 10 MG PO TABS: 10.0000 mg | ORAL_TABLET | Freq: Every day | ORAL | Status: AC

## 2015-11-19 NOTE — Discharge Summary (Signed)
OB Discharge Summary     Patient Name: Rebecca Sullivan DOB: 01-31-1991 MRN: [id number]  Date of admission: 11/15/2015 Delivering MD: Laurey Arrow BEDFORD   Date of discharge: 11/19/2015  Admitting diagnosis: direct admit Intrauterine pregnancy: [redacted]w[redacted]d    Secondary diagnosis:  Principal Problem:   Status post vaginal delivery Active Problems:   Supervision of high risk pregnancy, antepartum   Rubella non-immune status, antepartum   Susceptible to varicella (non-immune), currently pregnant   Chronic hypertension during pregnancy, antepartum   Oligohydramnios antepartum  Additional problems: None      Discharge diagnosis: Term Pregnancy Delivered                                                                                                Post partum procedures:None   Augmentation: Pitocin, Cytotec and Foley Balloon  Complications: None  Hospital course:  Induction of Labor With Vaginal Delivery   24y.o. yo G1P1001 at 364w2das admitted to the hospital 11/15/2015 for induction of labor.  Indication for induction: Gestational hypertension and Oligohydramnios.  Patient had an uncomplicated labor course as follows: Patient induction included Cytotec and foley utilized for ripening. Pitocin then given. No significant fetal heart rate abnormalities Membrane Rupture Time/Date: 11:50 PM ,11/16/2015   Intrapartum Procedures: Episiotomy: None [1]                                         Lacerations:  Periurethral [8]  Patient had delivery of a Viable infant.  Information for the patient's newborn:  Sullivan, Ennis0[[id number]]Delivery Method: Vaginal, Spontaneous Delivery (Filed from Delivery Summary)   11/17/2015  Details of delivery can be found in separate delivery note.  Patient had a routine postpartum course. Patient is discharged home 11/19/2015    Physical exam  Filed Vitals:   11/18/15 1800 11/18/15 1915 11/18/15 2318 11/19/15 0623  BP: 164/77 149/85 137/69  137/59  Pulse: 83 74 79 70  Temp: 97.8 F (36.6 C)   97.6 F (36.4 C)  TempSrc: Oral   Oral  Resp: 18   18  Height:      Weight:      SpO2:       General: alert, cooperative and no distress Lochia: appropriate Uterine Fundus: firm Incision: N/A DVT Evaluation: No evidence of DVT seen on physical exam. Labs: Lab Results  Component Value Date   WBC 17.4* 11/15/2015   HGB 11.2* 11/15/2015   HCT 34.7* 11/15/2015   MCV 75.6* 11/15/2015   PLT 256 11/15/2015   CMP Latest Ref Rng 11/15/2015  Glucose 65 - 99 mg/dL 106(H)  BUN 6 - 20 mg/dL 9  Creatinine 0.44 - 1.00 mg/dL 0.53  Sodium 135 - 145 mmol/L 136  Potassium 3.5 - 5.1 mmol/L 3.8  Chloride 101 - 111 mmol/L 105  CO2 22 - 32 mmol/L 21(L)  Calcium 8.9 - 10.3 mg/dL 9.4  Total Protein 6.5 - 8.1 g/dL 6.1(L)  Total Bilirubin 0.3 - 1.2 mg/dL 0.2(L)  Alkaline  Phos 38 - 126 U/L 148(H)  AST 15 - 41 U/L 19  ALT 14 - 54 U/L 12(L)    Discharge instruction: per After Visit Summary and "Baby and Me Booklet".  After visit meds:    Medication List    ASK your doctor about these medications        ascorbic acid 1000 MG tablet  Commonly known as:  VITAMIN C  Take 1,000 mg by mouth daily.     calcium carbonate 500 MG chewable tablet  Commonly known as:  TUMS - dosed in mg elemental calcium  Chew 1 tablet by mouth daily.     prenatal multivitamin Tabs tablet  Take 1 tablet by mouth daily at 12 noon.        Diet: routine diet  Activity: Advance as tolerated. Pelvic rest for 6 weeks.   Outpatient follow up:6 weeks Follow up Appt:Future Appointments Date Time Provider [place]  12/26/2015 2:15 PM Mora Bellman, MD [facility]   Follow up Visit:No Follow-up on file.  Postpartum contraception: IUD Mirena  Newborn Data: Live born female  Birth Weight: 6 lb 10.9 oz (3030 g) APGAR: 8, 9  Baby Feeding: Bottle and Breast Disposition:home with mother  Patient will need MMR and Varicella post-partum  Home on  Amlodipine 10 mg    11/19/2015 Asiyah Criss Rosales, MD  OB fellow attestation I have seen and examined this patient and agree with above documentation in the resident's note.   Rebecca Sullivan is a 24 y.o. G1P1001 s/p NSVD after IOL for gHTN and Oligohydramnios.   Pain is well controlled.  Plan for birth control is IUD.  Method of Feeding: Breast/Formula  PE:  BP 137/59 mmHg  Pulse 70  Temp(Src) 97.6 F (36.4 C) (Oral)  Resp 18  Ht 6' (1.829 m)  Wt 253 lb (114.76 kg)  BMI 34.31 kg/m2  SpO2 100%  LMP 02/12/2015  Breastfeeding? Unknown Gen: well appearing Heart: reg rate Lungs: normal WOB Fundus firm Ext: soft, no pain, no edema  No results for input(s): HGB, HCT in the last 72 hours.   Plan: discharge today - postpartum care discussed - f/u clinic in 6 weeks for postpartum visit - Patient will receive MMR prior to discharge, this was ordered and scheduled to administered.   Caren Macadam, MD 8:58 AM

## 2015-11-19 NOTE — Discharge Instructions (Signed)

## 2015-11-19 NOTE — Progress Notes (Signed)
Discharge instructions reviewed with patient, and she verbalized understanding... Baby will remain a baby patient, and mom will stay in room with baby.

## 2015-11-22 ENCOUNTER — Other Ambulatory Visit: Payer: Medicaid Other

## 2015-11-26 ENCOUNTER — Ambulatory Visit (HOSPITAL_COMMUNITY): Payer: Medicaid Other

## 2015-11-26 ENCOUNTER — Other Ambulatory Visit: Payer: Medicaid Other

## 2015-11-29 ENCOUNTER — Other Ambulatory Visit: Payer: Medicaid Other

## 2015-12-03 ENCOUNTER — Other Ambulatory Visit: Payer: Medicaid Other

## 2015-12-26 ENCOUNTER — Encounter: Payer: Self-pay | Admitting: Obstetrics and Gynecology

## 2015-12-26 ENCOUNTER — Ambulatory Visit (INDEPENDENT_AMBULATORY_CARE_PROVIDER_SITE_OTHER): Payer: Medicaid Other | Admitting: Obstetrics and Gynecology

## 2015-12-26 DIAGNOSIS — Z3202 Encounter for pregnancy test, result negative: Secondary | ICD-10-CM

## 2015-12-26 DIAGNOSIS — Z30017 Encounter for initial prescription of implantable subdermal contraceptive: Secondary | ICD-10-CM

## 2015-12-26 LAB — POCT PREGNANCY, URINE: PREG TEST UR: NEGATIVE

## 2015-12-26 NOTE — Progress Notes (Signed)
  Subjective:     Rebecca Sullivan is a 25 y.o. female who presents for a postpartum visit. She is 5 weeks postpartum following a spontaneous vaginal delivery. I have fully reviewed the prenatal and intrapartum course. The delivery was at 37 gestational weeks secondary to oligohydramnios. Outcome: spontaneous vaginal delivery. Anesthesia: epidural. Postpartum course has been uncomplicated. Baby's course has been uncomplicated. Baby is feeding by bottle - Carnation Good Start Soy. Bleeding no bleeding. Bowel function is normal. Bladder function is normal. Patient is sexually active. Contraception method is condoms. Postpartum depression screening: negative.     Review of Systems Pertinent items are noted in HPI.   Objective:    There were no vitals taken for this visit.  General:  alert, cooperative and no distress   Breasts:  inspection negative, no nipple discharge or bleeding, no masses or nodularity palpable  Lungs: clear to auscultation bilaterally  Heart:  regular rate and rhythm  Abdomen: soft, non-tender; bowel sounds normal; no masses,  no organomegaly   Vulva:  normal  Vagina: normal vagina  Cervix:  multiparous appearance  Corpus: normal size, contour, position, consistency, mobility, non-tender  Adnexa:  normal adnexa and no mass, fullness, tenderness  Rectal Exam: Not performed.        Assessment:     Normal postpartum exam. Pap smear not done at today's visit.   Plan:    1. Contraception: Nexplanon  Patient given informed consent, signed copy in the chart, time out was performed. Pregnancy test was negative. Appropriate time out taken.  Patient's left arm was prepped and draped in the usual sterile fashion.. The ruler used to measure and mark insertion area.  Patient was prepped with alcohol swab and then injected with 1 cc of 1% lidocaine with epinephrine.  Patient was prepped with betadine, Nexplanon removed form packaging.  Device confirmed in needle, then inserted  full length of needle and withdrawn per handbook instructions.  Patient insertion site covered with a pressure dressing.   Minimal blood loss.  Patient tolerated the procedure well.   2. Patient is medically cleared to resume all activities of daily living 3. Follow up in: 6 months for annual exam or as needed.

## 2016-07-26 IMAGING — US US OB COMP LESS 14 WK
1 series · 14 of 23 positions shown · non-contrast
Comparison: None.

CLINICAL DATA: Unsure dates, unsure viability; last menstrual
period: February 12, 2015

EXAM:
OBSTETRIC <14 WK US AND TRANSVAGINAL OB US
TECHNIQUE: Both transabdominal and transvaginal ultrasound examinations were
performed for complete evaluation of the gestation as well as the
maternal uterus, adnexal regions, and pelvic cul-de-sac.
Transvaginal technique was performed to assess early pregnancy.

[Series 1: us ob comp less 14 wk · 0.26mm/px · 14 of 23 slices shown]
[im 1/23]
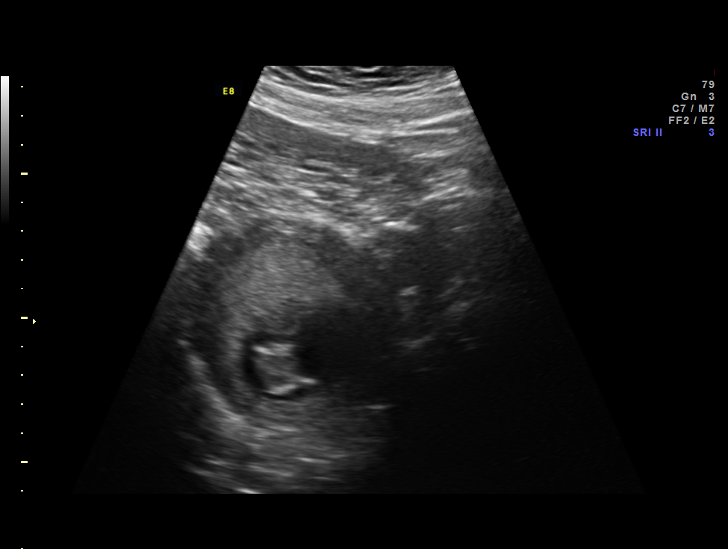
[im 3/23]
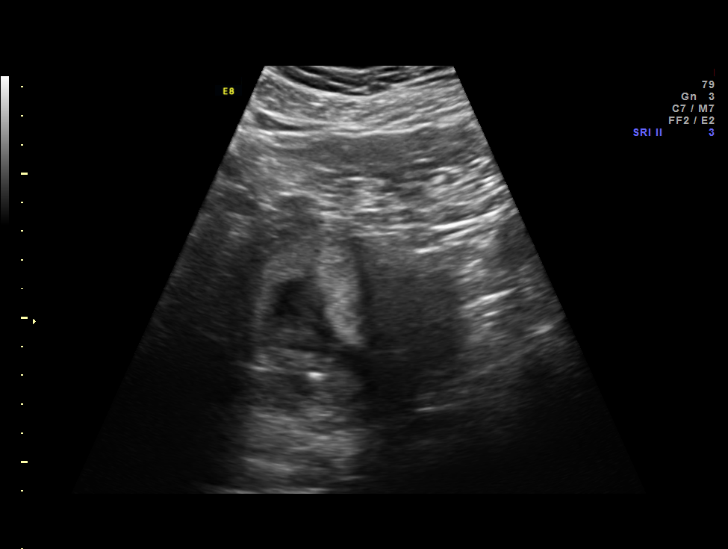
[im 5/23]
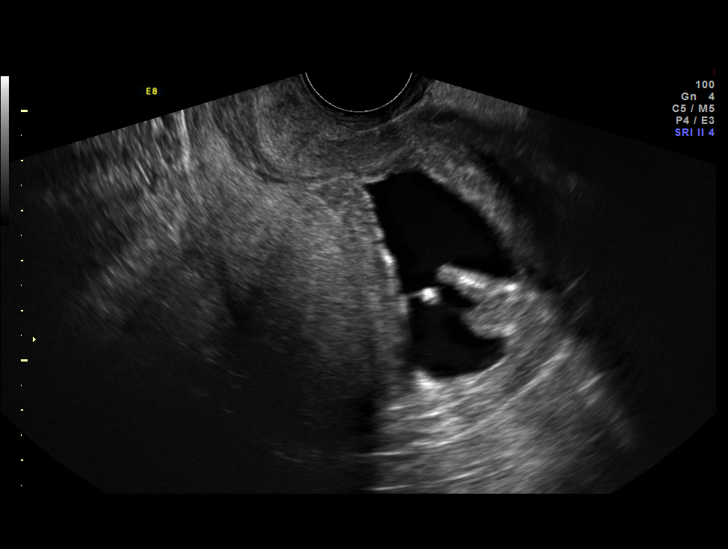
[im 6/23]
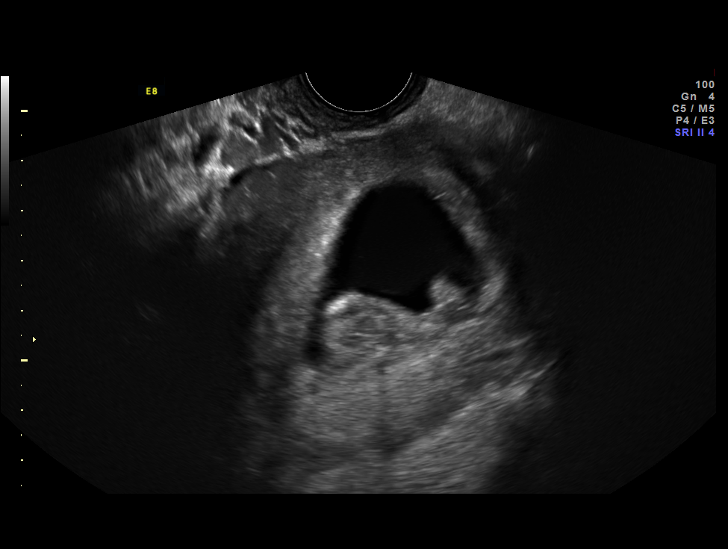
[im 8/23]
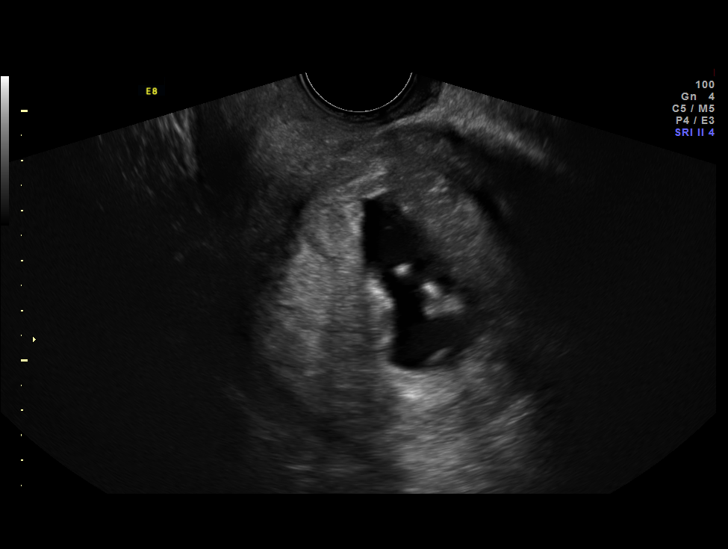
[im 10/23]
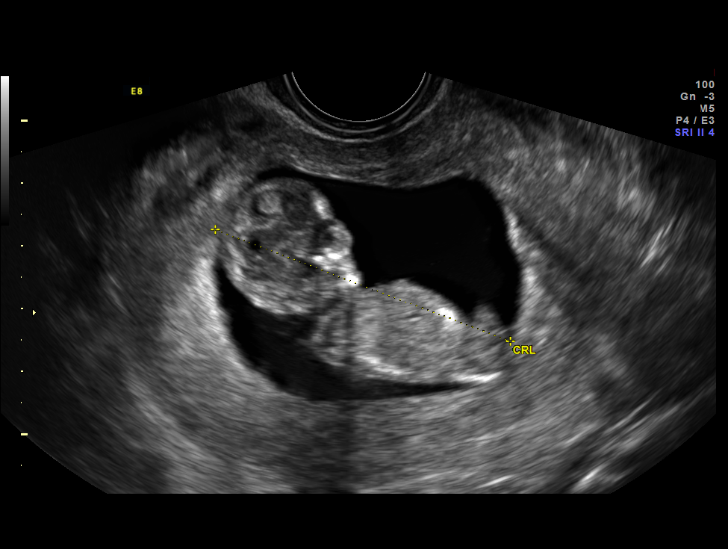
[im 11/23]
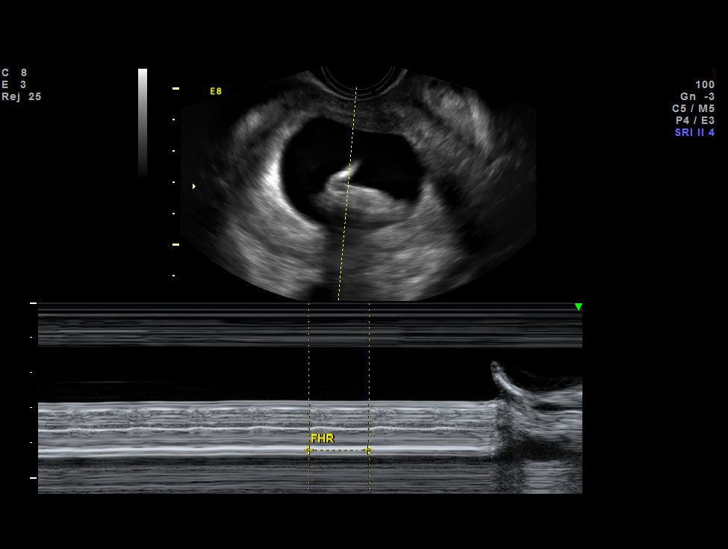
[im 13/23]
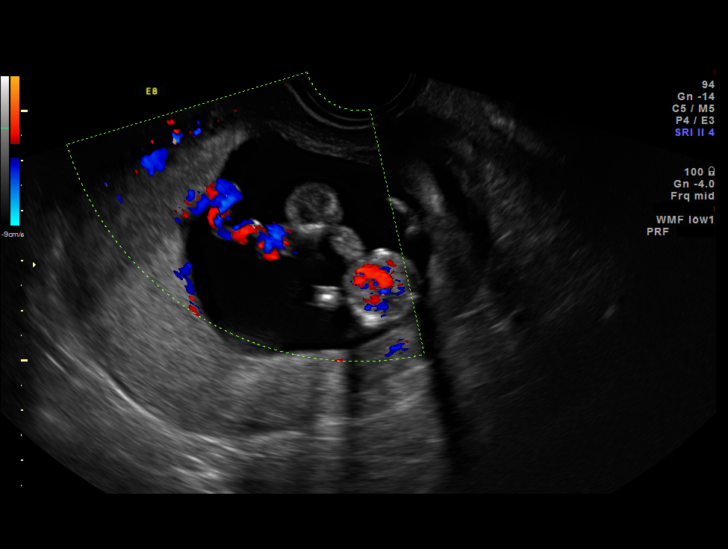
[im 14/23]
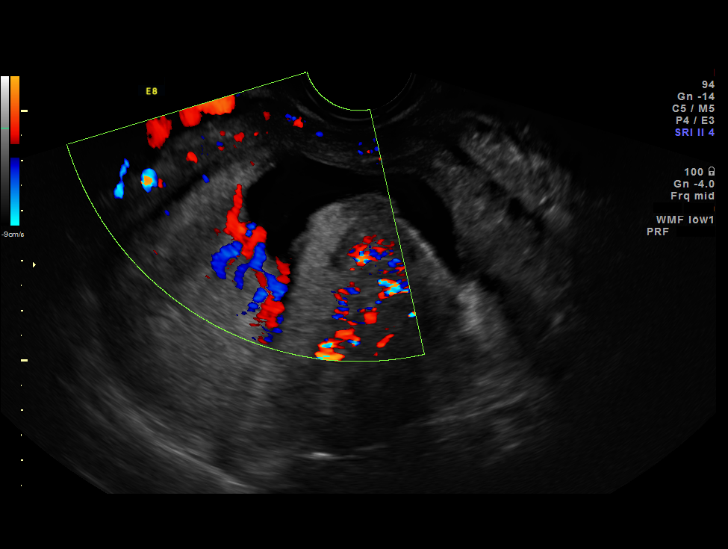
[im 16/23]
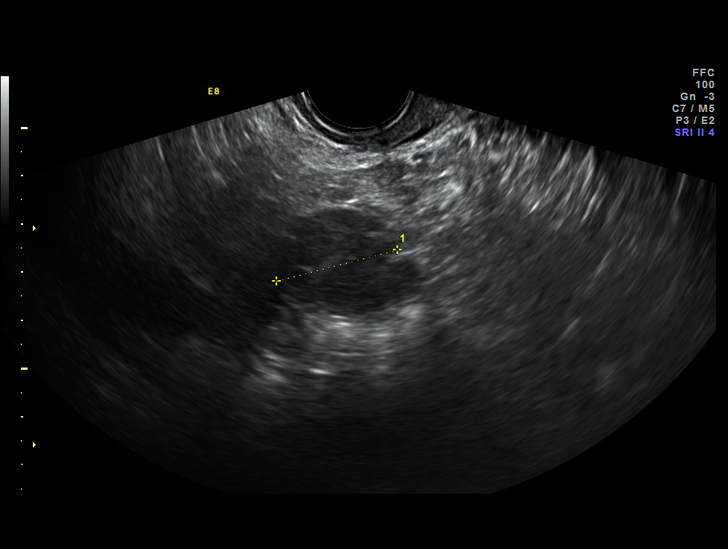
[im 18/23]
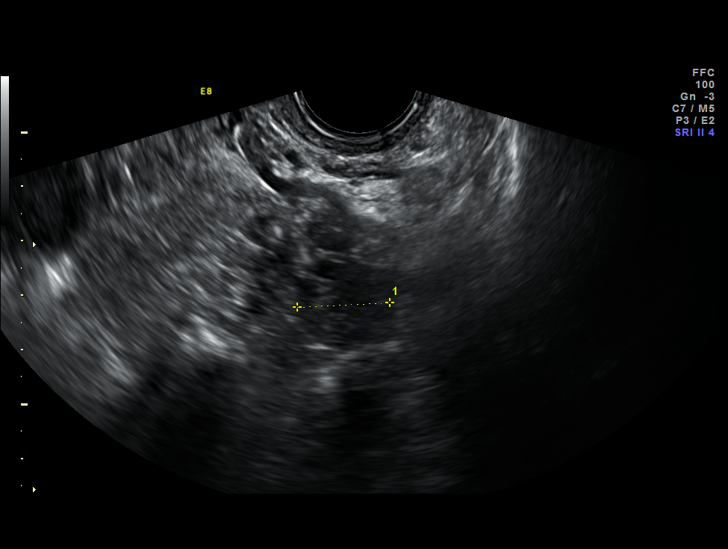
[im 19/23]
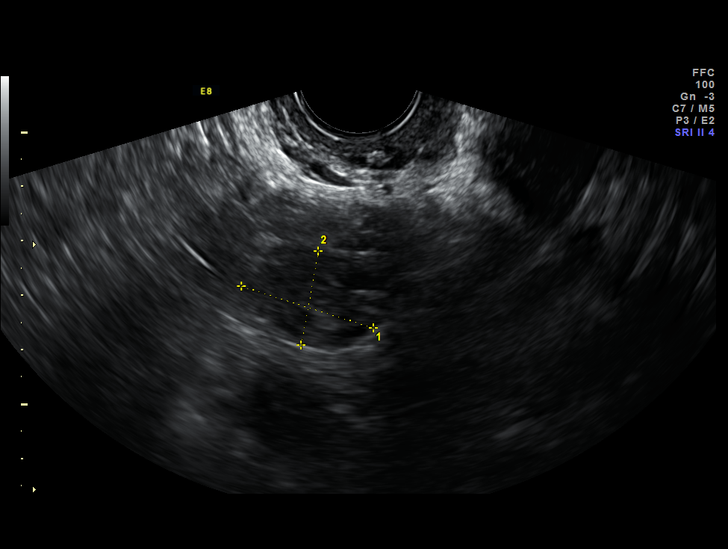
[im 21/23]
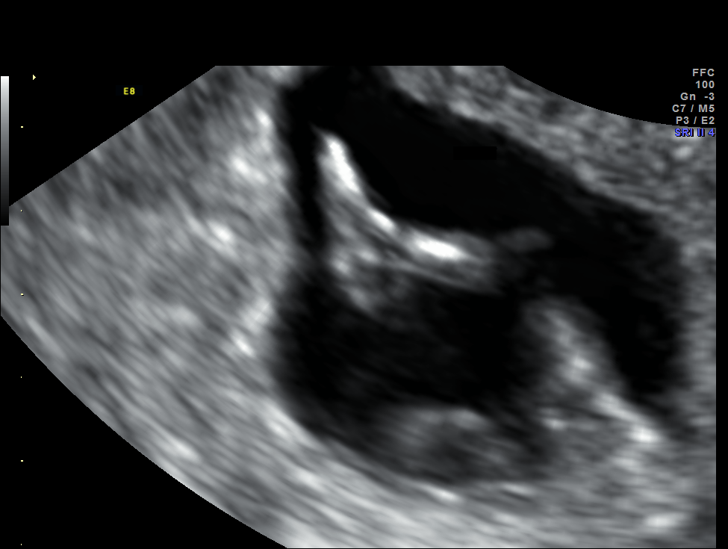
[im 23/23]
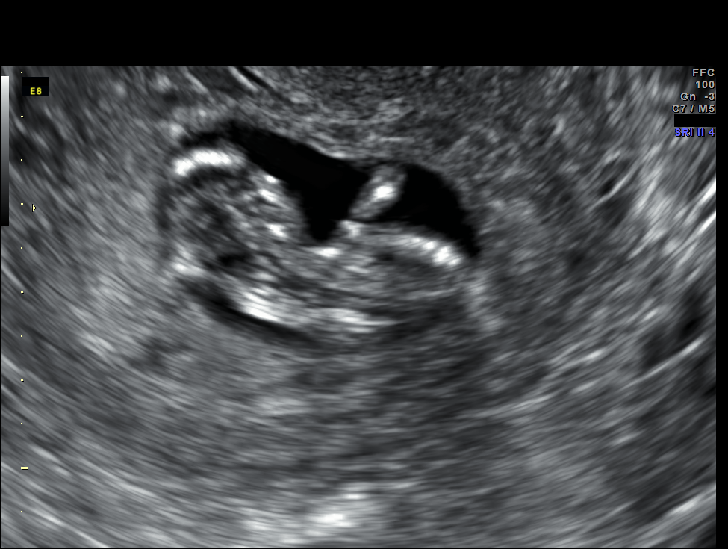

[14 of 23 positions shown; findings below may reference images not displayed]

FINDINGS: Intrauterine gestational sac: Visualized/normal in shape.

Yolk sac:  Not visualized

Embryo:  Visualized

Cardiac Activity: Visualize

Heart Rate: 167  bpm

CRL: 5.09 cm 11 w 6 d US EDC: December 06, 2015

Maternal uterus/adnexae: No subchorionic hemorrhage is observed. The
maternal ovaries are normal in size and echotexture. There is no
free pelvic fluid.
IMPRESSION: There is a viable IUP with estimated gestational age of 11 weeks 6
days. This corresponds to an estimated date of confinement December 06, 2015. The uterus and maternal adnexal structures
exhibit no acute abnormalities.

Follow-up ultrasound at 20-22 weeks is recommended for fetal
anatomic evaluation.
